# Patient Record
Sex: Female | Born: 1970 | Race: White | Hispanic: No | Marital: Married | Smoking: Never smoker
Health system: Southern US, Community
[De-identification: ages and names within clinical notes are randomized; demographics above are authoritative.]

## PROBLEM LIST (undated history)

## (undated) ENCOUNTER — Inpatient Hospital Stay (HOSPITAL_COMMUNITY): Payer: Self-pay

## (undated) DIAGNOSIS — T7840XA Allergy, unspecified, initial encounter: Secondary | ICD-10-CM

## (undated) DIAGNOSIS — R51 Headache: Secondary | ICD-10-CM

## (undated) DIAGNOSIS — R011 Cardiac murmur, unspecified: Secondary | ICD-10-CM

## (undated) HISTORY — DX: Allergy, unspecified, initial encounter: T78.40XA

## (undated) HISTORY — DX: Cardiac murmur, unspecified: R01.1

## (undated) HISTORY — DX: Headache: R51

---

## 2006-07-18 ENCOUNTER — Ambulatory Visit: Payer: Self-pay | Admitting: Family Medicine

## 2006-08-14 ENCOUNTER — Encounter: Payer: Self-pay | Admitting: Family Medicine

## 2006-08-14 ENCOUNTER — Encounter: Admission: RE | Admit: 2006-08-14 | Discharge: 2006-08-14 | Payer: Self-pay | Admitting: Gynecology

## 2006-08-22 ENCOUNTER — Encounter: Admission: RE | Admit: 2006-08-22 | Discharge: 2006-08-22 | Payer: Self-pay | Admitting: Gynecology

## 2006-09-18 ENCOUNTER — Ambulatory Visit (HOSPITAL_BASED_OUTPATIENT_CLINIC_OR_DEPARTMENT_OTHER): Admission: RE | Admit: 2006-09-18 | Discharge: 2006-09-18 | Payer: Self-pay | Admitting: Gynecology

## 2006-09-18 ENCOUNTER — Encounter (INDEPENDENT_AMBULATORY_CARE_PROVIDER_SITE_OTHER): Payer: Self-pay | Admitting: Specialist

## 2006-10-17 ENCOUNTER — Ambulatory Visit: Payer: Self-pay | Admitting: Family Medicine

## 2007-10-09 ENCOUNTER — Other Ambulatory Visit: Admission: RE | Admit: 2007-10-09 | Discharge: 2007-10-09 | Payer: Self-pay | Admitting: Gynecology

## 2007-11-02 ENCOUNTER — Other Ambulatory Visit: Admission: RE | Admit: 2007-11-02 | Discharge: 2007-11-02 | Payer: Self-pay | Admitting: Gynecology

## 2008-04-04 ENCOUNTER — Ambulatory Visit: Payer: Self-pay | Admitting: Family Medicine

## 2008-04-04 DIAGNOSIS — R51 Headache: Secondary | ICD-10-CM

## 2008-04-04 DIAGNOSIS — Z8679 Personal history of other diseases of the circulatory system: Secondary | ICD-10-CM | POA: Insufficient documentation

## 2008-04-04 DIAGNOSIS — R519 Headache, unspecified: Secondary | ICD-10-CM | POA: Insufficient documentation

## 2008-05-10 ENCOUNTER — Ambulatory Visit: Payer: Self-pay | Admitting: Gynecology

## 2008-07-04 ENCOUNTER — Ambulatory Visit: Payer: Self-pay | Admitting: Gynecology

## 2008-07-12 ENCOUNTER — Ambulatory Visit: Payer: Self-pay | Admitting: Family Medicine

## 2008-07-12 DIAGNOSIS — H9190 Unspecified hearing loss, unspecified ear: Secondary | ICD-10-CM | POA: Insufficient documentation

## 2008-09-30 ENCOUNTER — Ambulatory Visit: Payer: Self-pay | Admitting: Gynecology

## 2008-10-25 ENCOUNTER — Other Ambulatory Visit: Admission: RE | Admit: 2008-10-25 | Discharge: 2008-10-25 | Payer: Self-pay | Admitting: Gynecology

## 2008-10-25 ENCOUNTER — Encounter: Payer: Self-pay | Admitting: Gynecology

## 2008-10-25 ENCOUNTER — Ambulatory Visit: Payer: Self-pay | Admitting: Gynecology

## 2008-10-26 ENCOUNTER — Ambulatory Visit: Payer: Self-pay | Admitting: Gynecology

## 2008-11-11 ENCOUNTER — Ambulatory Visit: Payer: Self-pay | Admitting: Gynecology

## 2008-11-15 ENCOUNTER — Ambulatory Visit: Payer: Self-pay | Admitting: Gynecology

## 2008-11-18 ENCOUNTER — Ambulatory Visit: Payer: Self-pay | Admitting: Gynecology

## 2008-11-23 ENCOUNTER — Ambulatory Visit: Payer: Self-pay | Admitting: Family Medicine

## 2008-11-23 LAB — CONVERTED CEMR LAB

## 2008-11-24 LAB — CONVERTED CEMR LAB
AST: 25 units/L (ref 0–37)
Albumin: 3.7 g/dL (ref 3.5–5.2)
Basophils Absolute: 0 10*3/uL (ref 0.0–0.1)
Bilirubin, Direct: 0 mg/dL (ref 0.0–0.3)
Cholesterol: 163 mg/dL (ref 0–200)
Eosinophils Absolute: 0.3 10*3/uL (ref 0.0–0.7)
HDL: 58.4 mg/dL (ref >39.00)
Hemoglobin: 12.8 g/dL (ref 12.0–15.0)
LDL Cholesterol: 89 mg/dL (ref 0–99)
MCHC: 35.1 g/dL (ref 30.0–36.0)
MCV: 91.1 fL (ref 78.0–100.0)
Platelets: 245 10*3/uL (ref 150.0–400.0)
Potassium: 4.5 meq/L (ref 3.5–5.1)
RDW: 11.7 % (ref 11.5–14.6)
VLDL: 16 mg/dL (ref 0.0–40.0)
WBC: 7 10*3/uL (ref 4.5–10.5)

## 2008-11-30 ENCOUNTER — Ambulatory Visit: Payer: Self-pay | Admitting: Family Medicine

## 2009-03-09 ENCOUNTER — Ambulatory Visit (HOSPITAL_COMMUNITY): Admission: RE | Admit: 2009-03-09 | Discharge: 2009-03-09 | Payer: Self-pay | Admitting: Obstetrics and Gynecology

## 2009-03-14 ENCOUNTER — Ambulatory Visit (HOSPITAL_COMMUNITY): Admission: RE | Admit: 2009-03-14 | Discharge: 2009-03-14 | Payer: Self-pay | Admitting: Obstetrics and Gynecology

## 2009-03-21 ENCOUNTER — Ambulatory Visit (HOSPITAL_COMMUNITY): Admission: RE | Admit: 2009-03-21 | Discharge: 2009-03-21 | Payer: Self-pay | Admitting: Obstetrics and Gynecology

## 2009-03-25 ENCOUNTER — Inpatient Hospital Stay (HOSPITAL_COMMUNITY): Admission: AD | Admit: 2009-03-25 | Discharge: 2009-03-27 | Payer: Self-pay | Admitting: Obstetrics and Gynecology

## 2009-05-02 ENCOUNTER — Ambulatory Visit (HOSPITAL_COMMUNITY): Admission: RE | Admit: 2009-05-02 | Discharge: 2009-05-02 | Payer: Self-pay | Admitting: Obstetrics and Gynecology

## 2009-07-15 ENCOUNTER — Inpatient Hospital Stay (HOSPITAL_COMMUNITY): Admission: AD | Admit: 2009-07-15 | Discharge: 2009-07-17 | Payer: Self-pay | Admitting: Obstetrics and Gynecology

## 2009-08-11 ENCOUNTER — Ambulatory Visit: Admission: RE | Admit: 2009-08-11 | Discharge: 2009-08-11 | Payer: Self-pay | Admitting: Obstetrics and Gynecology

## 2009-12-28 ENCOUNTER — Ambulatory Visit: Payer: Self-pay | Admitting: Family Medicine

## 2009-12-29 LAB — CONVERTED CEMR LAB
AST: 16 units/L (ref 0–37)
Albumin: 4.3 g/dL (ref 3.5–5.2)
Alkaline Phosphatase: 81 units/L (ref 39–117)
Basophils Absolute: 0 10*3/uL (ref 0.0–0.1)
Chloride: 103 meq/L (ref 96–112)
Cholesterol: 164 mg/dL (ref 0–200)
Eosinophils Absolute: 0.1 10*3/uL (ref 0.0–0.7)
Eosinophils Relative: 3.1 % (ref 0.0–5.0)
GFR calc non Af Amer: 165.01 mL/min (ref >60)
Glucose, Bld: 76 mg/dL (ref 70–99)
HDL: 64.4 mg/dL (ref >39.00)
Hemoglobin: 12.8 g/dL (ref 12.0–15.0)
LDL Cholesterol: 96 mg/dL (ref 0–99)
Lymphocytes Relative: 30.3 % (ref 12.0–46.0)
MCHC: 34.7 g/dL (ref 30.0–36.0)
Monocytes Absolute: 0.4 10*3/uL (ref 0.1–1.0)
Monocytes Relative: 10.1 % (ref 3.0–12.0)
Neutro Abs: 2.2 10*3/uL (ref 1.4–7.7)
RDW: 13.8 % (ref 11.5–14.6)
Specific Gravity, Urine: >=1.03 (ref 1.000–1.030)
Total Protein, Urine: NEGATIVE mg/dL
Total Protein: 7 g/dL (ref 6.0–8.3)
Urine Glucose: NEGATIVE mg/dL
VLDL: 3.6 mg/dL (ref 0.0–40.0)
WBC: 4 10*3/uL — ABNORMAL LOW (ref 4.5–10.5)

## 2010-01-09 ENCOUNTER — Ambulatory Visit: Payer: Self-pay | Admitting: Family Medicine

## 2010-03-02 ENCOUNTER — Ambulatory Visit: Payer: Self-pay | Admitting: Family Medicine

## 2010-06-03 ENCOUNTER — Encounter: Payer: Self-pay | Admitting: Obstetrics and Gynecology

## 2010-06-12 NOTE — Assessment & Plan Note (Signed)
Summary: CPX/CJR   Vital Signs:  Patient profile:   40 year old female Height:      63 inches Weight:      117 pounds BMI:     20.80 O2 Sat:      97 % Temp:     98.2 degrees F Pulse rate:   78 / minute BP sitting:   102 / 62  (left arm)  Vitals Entered By: Pura Spice, RN (January 09, 2010 9:20 AM) CC: cpx has a 55 month old in which she is breast pumping. has questions about fish oil and alopecia. states lower back gets sore.  sees dr love gyn. Is Patient Diabetic? No   History of Present Illness: 40 yr old female for a cpx. She is doing well with no complaints. She gave birth 5 and 1/2 months ago, and she is still pumping breast milk. She may decide to stop in the next month or so.   Preventive Screening-Counseling & Management  Alcohol-Tobacco     Smoking Status: never  Allergies (verified): No Known Drug Allergies  Past History:  Past Medical History: Allergic rhinitis Headache Heart Murmur as child, resolved sees Dr. Rana Snare  for GYN exams  Past Surgical History: Infertility procedures 7/08  Family History: Reviewed history from 04/04/2008 and no changes required. Family History of Arthritis Family History of CAD Female 1st degree relative <50 Family History of Colon CA 1st degree relative <60 Family History Diabetes 1st degree relative Family History Hypertension Family History Lung cancer Family History Ovarian cancer Family History of Stroke M 1st degree relative <50  Social History: Reviewed history from 04/04/2008 and no changes required. Married Never Smoked Alcohol use-yes Drug use-no Regular exercise-yes  Review of Systems  The patient denies anorexia, fever, weight loss, weight gain, vision loss, decreased hearing, hoarseness, chest pain, syncope, dyspnea on exertion, peripheral edema, prolonged cough, headaches, hemoptysis, abdominal pain, melena, hematochezia, severe indigestion/heartburn, hematuria, incontinence, genital sores, muscle  weakness, suspicious skin lesions, transient blindness, difficulty walking, depression, unusual weight change, abnormal bleeding, enlarged lymph nodes, angioedema, breast masses, and testicular masses.    Physical Exam  General:  Well-developed,well-nourished,in no acute distress; alert,appropriate and cooperative throughout examination Head:  Normocephalic and atraumatic without obvious abnormalities. No apparent alopecia or balding. Eyes:  No corneal or conjunctival inflammation noted. EOMI. Perrla. Funduscopic exam benign, without hemorrhages, exudates or papilledema. Vision grossly normal. Ears:  External ear exam shows no significant lesions or deformities.  Otoscopic examination reveals clear canals, tympanic membranes are intact bilaterally without bulging, retraction, inflammation or discharge. Hearing is grossly normal bilaterally. Nose:  External nasal examination shows no deformity or inflammation. Nasal mucosa are pink and moist without lesions or exudates. Mouth:  Oral mucosa and oropharynx without lesions or exudates.  Teeth in good repair. Neck:  No deformities, masses, or tenderness noted. Chest Wall:  No deformities, masses, or tenderness noted. Lungs:  Normal respiratory effort, chest expands symmetrically. Lungs are clear to auscultation, no crackles or wheezes. Heart:  Normal rate and regular rhythm. S1 and S2 normal without gallop, murmur, click, rub or other extra sounds. Abdomen:  Bowel sounds positive,abdomen soft and non-tender without masses, organomegaly or hernias noted. Msk:  No deformity or scoliosis noted of thoracic or lumbar spine.   Pulses:  R and L carotid,radial,femoral,dorsalis pedis and posterior tibial pulses are full and equal bilaterally Extremities:  No clubbing, cyanosis, edema, or deformity noted with normal full range of motion of all joints.   Neurologic:  No  cranial nerve deficits noted. Station and gait are normal. Plantar reflexes are down-going  bilaterally. DTRs are symmetrical throughout. Sensory, motor and coordinative functions appear intact. Skin:  Intact without suspicious lesions or rashes Cervical Nodes:  No lymphadenopathy noted Axillary Nodes:  No palpable lymphadenopathy Inguinal Nodes:  No significant adenopathy Psych:  Cognition and judgment appear intact. Alert and cooperative with normal attention span and concentration. No apparent delusions, illusions, hallucinations   Impression & Recommendations:  Problem # 1:  HEALTH MAINTENANCE EXAM (ICD-V70.0)  Complete Medication List: 1)  Citranatal Rx 27-1 Mg Tabs (Prenat w/o [id number]) .... Once daily 2)  Vitamin D 400 Unit Caps (Cholecalciferol) .... Once daily  Patient Instructions: 1)  Please schedule a follow-up appointment in 1 year.

## 2010-06-12 NOTE — Assessment & Plan Note (Signed)
Summary: FLU SHOT/CJR/PT RSC/CJR  Nurse Visit   Review of Systems       Flu Vaccine Consent Questions     Do you have a history of severe allergic reactions to this vaccine? no    Any prior history of allergic reactions to egg and/or gelatin? no    Do you have a sensitivity to the preservative Thimersol? no    Do you have a past history of Guillan-Barre Syndrome? no    Do you currently have an acute febrile illness? no    Have you ever had a severe reaction to latex? no    Vaccine information given and explained to patient? yes    Are you currently pregnant? no    Lot Number:[id number]   Exp Date:11/10/2010   Site Given  Left Deltoid IM Pura Spice, RN  March 02, 2010 2:52 PM    Allergies: No Known Drug Allergies  Orders Added: 1)  Flu Vaccine 41yrs + MEDICARE PATIENTS [Q2039] 2)  Administration Flu vaccine - MCR [G0008]

## 2010-08-06 LAB — CBC
HCT: 36.1 % (ref 36.0–46.0)
Hemoglobin: 12.4 g/dL (ref 12.0–15.0)
Platelets: 118 10*3/uL — ABNORMAL LOW (ref 150–400)
Platelets: 153 10*3/uL (ref 150–400)
RBC: 3.28 MIL/uL — ABNORMAL LOW (ref 3.87–5.11)
RDW: 13.2 % (ref 11.5–15.5)
RDW: 13.8 % (ref 11.5–15.5)
WBC: 10.3 10*3/uL (ref 4.0–10.5)
WBC: 11.5 10*3/uL — ABNORMAL HIGH (ref 4.0–10.5)

## 2010-08-15 LAB — URINALYSIS, ROUTINE W REFLEX MICROSCOPIC
Glucose, UA: NEGATIVE mg/dL
Hgb urine dipstick: NEGATIVE
Ketones, ur: NEGATIVE mg/dL
Specific Gravity, Urine: 1.02 (ref 1.005–1.030)
pH: 5.5 (ref 5.0–8.0)

## 2010-08-15 LAB — CBC
HCT: 33.6 % — ABNORMAL LOW (ref 36.0–46.0)
Hemoglobin: 11.4 g/dL — ABNORMAL LOW (ref 12.0–15.0)
MCV: 93.8 fL (ref 78.0–100.0)
Platelets: 215 10*3/uL (ref 150–400)
RDW: 12.8 % (ref 11.5–15.5)
WBC: 9.4 10*3/uL (ref 4.0–10.5)

## 2010-08-15 LAB — WET PREP, GENITAL: Yeast Wet Prep HPF POC: NONE SEEN

## 2010-08-27 ENCOUNTER — Other Ambulatory Visit: Payer: Self-pay | Admitting: Obstetrics and Gynecology

## 2010-08-27 DIAGNOSIS — Z1231 Encounter for screening mammogram for malignant neoplasm of breast: Secondary | ICD-10-CM

## 2010-08-30 ENCOUNTER — Ambulatory Visit
Admission: RE | Admit: 2010-08-30 | Discharge: 2010-08-30 | Disposition: A | Discharge status: Home / Self Care | Payer: Managed Care, Other (non HMO) | Source: Ambulatory Visit | Attending: Obstetrics and Gynecology | Admitting: Obstetrics and Gynecology

## 2010-08-30 DIAGNOSIS — Z1231 Encounter for screening mammogram for malignant neoplasm of breast: Secondary | ICD-10-CM

## 2010-09-28 NOTE — H&P (Signed)
NAMECAYLOR, Tracy Baldwin         ACCOUNT NO.:  [id number]   MEDICAL RECORD NO.:  [id number]          PATIENT TYPE:  AMB   LOCATION:  [facility]                         FACILITY:  [facility], [place]   PHYSICIAN:  Timothy P. Fontaine, M.D.DATE OF BIRTH:  [birth year]   DATE OF ADMISSION:  09/18/2006  DATE OF DISCHARGE:                              HISTORY & PHYSICAL   CHIEF COMPLAINT:  Increasing dysmenorrhea, menorrhagia, family history  of endometriosis, infertility.   HISTORY OF PRESENT ILLNESS:  [age]-year-old G-0 with increasing  dysmenorrhea, menorrhagia, family history of endometriosis, who has been  attempting pregnancy without success despite stimulated cycles with good  ovarian response.  The patient is admitted at this time for  hysteroscopy, chromopertubation laparoscopy, rule out endometriosis,  other pelvic pathology.   PAST MEDICAL HISTORY:  Uncomplicated.   PAST SURGICAL HISTORY:  Cautery of cervix x2, wisdom teeth.   ALLERGIES:  No medications.   CURRENT MEDICATIONS:  None.   REVIEW OF SYSTEMS:  Noncontributory.   FAMILY HISTORY:  Noncontributory.   SOCIAL HISTORY:  Noncontributory.   PHYSICAL EXAMINATION:  Afebrile.  Vital signs are stable.  HEENT:  Normal.  LUNGS:  Clear.  CARDIAC:  Regular rate, no rubs, murmurs or gallops.  ABDOMINAL EXAM:  Benign.  PELVIC:  External BUS, vagina normal.  Cervix normal.  Uterus normal  size, midline, mobile, nontender.  Adnexa without masses or tenderness.   ASSESSMENT:  [age]-year-old G-0, history of infertility undergoing  stimulated cycles without pregnancy, history of increasing dysmenorrhea,  increasingly heavy periods, strong family history of endometriosis for  laparoscopy, chromopertubation, hysteroscopy.  I reviewed with the  patient the proposed surgery to include instrumentation, use of the  hysteroscope, laparoscope, trocar placement, multiple port sites, sharp  blunt dissection, electrocautery use of laser in  the chromopertubation.  Expected intraoperative postoperative courses, acute intraoperative  postoperative risks were all reviewed with her.  She understands there  are no guarantees as far as results and subsequent pregnancies, and she  understands and accepts this.  The risks of inadvertent injury to  internal organs either during the laparoscopic portion or  hysteroscopically secondary to perforation leading to damage to internal  organs, bowel, bladder, ureters, vessels and nerves either immediately  recognized or delay recognized following the surgery necessitating major  exploratory reparative surgeries, bowel resection, future reparative  surgeries, ostomy formation was all discussed, understood and accepted.  The risks of major vascular injury, hemorrhage necessitating transfusion  and the risks of transfusion were reviewed to include transfusion  reaction, hepatitis, HIV, mad cow disease and other unknown entities.  The risks of infection following the procedure were discussed, potential  needs for prolonged antibiotics, incisional complications, incisional  infections, hernia formation, opening and draining of incisions, closure  by secondary intention was all discussed, understood and accepted.  The  patient's questions were answered to her satisfaction.  She is ready to  proceed with surgery and again she understands there are no guarantees  as far as pregnancies following this procedure and she understands and  accepts this.      Timothy P. Fontaine, M.D.  Electronically Signed     TPF/MEDQ  D:  09/04/2006  T:  09/04/2006  Job:  [id number]

## 2010-09-28 NOTE — Op Note (Signed)
Tracy Baldwin, Tracy Baldwin         ACCOUNT NO.:  [id number]   MEDICAL RECORD NO.:  [id number]          PATIENT TYPE:  AMB   LOCATION:  [facility]                         FACILITY:  [facility]   PHYSICIAN:  Timothy P. Fontaine, M.D.DATE OF BIRTH:  Mar 25, 1971   DATE OF PROCEDURE:  09/18/2006  DATE OF DISCHARGE:                               OPERATIVE REPORT   PREOPERATIVE DIAGNOSIS:  Increasing dysmenorrhea, menorrhagia,  infertility, rule out endometriosis.   POSTOPERATIVE DIAGNOSE:  1. endometriosis.  2. Right ovarian cyst.   PROCEDURE:  1. Diagnostic hysteroscopy.  2. Laparoscopic biopsy fulguration endometriosis.  3. Excision right ovarian cyst.  4. Chromopertubation.   SURGEON:  Dr. Audie Box.   ANESTHETIC:  General.   ESTIMATED BLOOD LOSS:  Minimal.   SPECIMENS:  1. Peritoneal biopsy.  2. Right ovarian cyst.   COMPLICATIONS:  None.   FINDINGS:  1. EUA external BUS, vagina normal; cervix normal, uterus normal size,      anteverted; adnexa without masses.  2. Hysteroscopic is normal noting fundus, anterior-posterior uterine      surfaces, lower uterine segment, right and left cornual regions,      and the cervical canal all visualized and normal.  3. Laparoscopic.  Anterior cul-de-sac with classic powder-burn lesion,      right vesicouterine fold.  Posterior cul-de-sac with multiple      classic powder-burn lesions mid posterior cul-de-sac.  Uterus      normal size, shape and contour; right and left fallopian tubes      normal length, caliber, fimbriated ends, positive fill and spill      bilaterally.  Left ovary grossly normal, free and mobile.  Right      ovary with cystic mass, pedunculated-like off of the cortex      overlying brownish stainings, questionable endometrioma versus      corpus lutetium.  The area was incised with chocolate fluid      extruding, although surrounding stroma appeared to be a classic      corpus luteum.  The cyst was excised in its entirety.   Upper      abdominal exam was grossly normal.  Liver smooth, no abnormalities,      gallbladder visualized and normal.  Appendix grossly normal, free,      and mobile.  No upper abdominal adhesions or other pathology.   PROCEDURE:  The patient was taken to the operating room, underwent  general anesthesia, was placed in low dorsal lithotomy position,  received an abdominal, perineal, vaginal preparation with Betadine  solution per nursing personnel, and the bladder was emptied with in-and-  out Foley catheterization.  The patient was draped in the usual fashion,  EUA performed.  Circumflex visualized with a speculum anterior lip  grasped with a single-tooth tenaculum, and the cervix was gently dilated  to admit the diagnostic hysteroscope.  Hysteroscopy was performed with  findings noted above.  The Cowen cannula was then placed on the cervix  for the chromopertubation.  After regloving, attention was then turned  to the laparoscopic portion of the case.  A vertical infraumbilical  incision was made.  The 10 mm laparoscopic visual  direct entry port was  then placed under direct visualization without difficulty.  The abdomen  was subsequently insufflated.  Right and left 5 mm suprapubic ports were  then placed without difficulty under direct visualization after  transillumination for the vessels.  Examination of the pelvic organs;  upper abdominal exam was then carried out with findings noted above.  A  biopsy of the anterior cul-de-sac endometriotic implant was taken, and  subsequently all visible areas of endometriosis were bipolar cauterized.  There was no residual endometriosis visualized.  Chromopertubation was  then performed showing fill and spill bilaterally and lastly, the right  ovarian cyst was inspected.  Due to the brownish cortex staining, the  question of an endometrioma was raised, and it was felt prudent to go  ahead and excise this area.  On incision, chocolate material  extruded,  although inspection of the surrounding cortex appeared to be a classic  corpus luteum.  The cyst was excised.  Subsequently bipolar cautery was  applied to the stroma and cortex edge to achieve ultimate hemostasis.  The pelvis was copiously irrigated.  Hemostasis visualized at all sites.  Intercede was then placed over all biopsy fulguration and right ovarian  excision site.  The suprapubic ports were removed.  Gas was allowed to  escape, all sites inspected under a low pressure situation showing  adequate hemostasis.  Subsequently the infraumbilical port was then  backed down under direct visualization showing adequate hemostasis and  no evidence of hernia formation.  All skin sites were injected using  0.25% Marcaine.  The suprapubic ports were closed using Dermabond skin  adhesive and due to some slight oozing in the infraumbilical port, this  incision was closed using 4-0 Monocryl and interrupted cutaneous stitch.  Dressing was applied.  The Cowen cannula and tenaculum were removed from  the cervix.  The Foley catheter which had previously been placed under  sterile technique had been removed, the patient placed in a supine  position, awakened without difficulty and taken to the recovery room in  good condition having tolerated the procedure well.      Timothy P. Fontaine, M.D.  Electronically Signed     TPF/MEDQ  D:  09/18/2006  T:  09/18/2006  Job:  [id number]

## 2010-09-28 NOTE — Assessment & Plan Note (Signed)
[facility] OFFICE NOTE   Tracy, Baldwin                  MRN:          [id number]  DATE:07/18/2006                            DOB:          01/03/1971    This is a 40 year old woman here to establish with our practice with  several issues to discuss.  She moved to [place] from [place],  [place] last summer, and is just now establishing with a physician.  First off, she has had 2 different painful spots on the right hand and  wrist, both of which began at the same time about 1 year ago.  She has a  painful nodule around the base knuckle of her right 3rd finger.  It  seems to get larger at times, and gets smaller at times, but it never  goes away.  Occasionally, it is mildly painful when doing her daily  activities, but most of the time it does not bother her.  It does not  inhibit her range of motion either.  A bit more painful of late,  however, has been a tender spot on the back of her right wrist.  There  have been no lumps or nodules that she could find.  No swelling.  Certainly, no history of trauma with it.  She has tried Tylenol  intermittently, but it does not seem to help.  Of note, she and her  husband are currently going through treatments for infertility, so she  has been prohibited from taking any type of nonsteroidal  antiinflammatory medications.  Also, about 1 week ago while putting on a  pair of pants, she twisted her left 2nd toe.  It was mildly swollen and  mildly painful at first.  She would like for me to look at it now, but  it does not bother her to walk on it.  Lastly, she wonders if she may be  allergic to cats.  She occasionally has trouble with itchy eyes, runny  nose, and sneezing.  This seems to be worse when she is in a household  with cats in it.  She is asking for me to refer her to an allergist for  full allergy testing.  She has used Claritin and other agents over  the  counter with fairly good results.   PAST MEDICAL HISTORY:  She is a G0 P0.  She and her husband have been  trying to get pregnant for the past 2 years.  She is currently working  with Dr. Audie Box along these lines.  She is taking Clomid, and is  taking some other type of shots around ovulation to help her with this  issue.  She has had a full workup, and so has her husband, and no  particular cause of the infertility has been found.  They have gone 2  sets of intrauterine inseminations, and will be trying their 3rd time  coming up some time soon.  Otherwise, she had a heart murmur when she  was younger, but seems to have grown out of it.  She has migraine  headaches, which are well controlled over-the-counter medications.  They  are not particularly bad, and not particularly frequent.  She had an  episode of blood in her stool in the late 1990s.  Workup included a full  colonoscopy that was completely normal, although the cause was  determined to be a small anal fissure that healed.   HABITS:  She drinks some alcohol.  She does not use tobacco.   SOCIAL HISTORY:  She is married.  She works as a Comptroller.   FAMILY HISTORY:  Remarkable for precancerous colon polyps in her father,  and colon cancer in an aunt.  Also, some history of stroke,  hypertension, and diabetes in the family.   OBJECTIVE:  Height 5 feet 2 inches.  Weight 128.  BP 98/70.  Pulse 72  and regular.  GENERAL:  She appears to be healthy.  She walks easily.  Examination of her left foot is completely  unremarkable.  Examination of the right hand shows a small, mobile,  tender ganglion cyst on the flexor surface of the 3rd  metacarpophalangeal joint.  She also has a small, mobile, tender cyst on  the dorsal surface of the right wrist.  It does not inhibit range of  motion.   ASSESSMENT AND PLAN:  1. Two ganglion cysts on the right wrist and hand.  I advised her to      purchase an over-the-counter wrist brace  to immobilize the area to      see if it cannot get better.  Obviously, we can use      antiinflammatory nonsteroidal drugs at this time.  If a month or so      goes by, and it is still bothering her, we may refer her to      Orthopedics.  2. Recent toe sprain.  Apparently resolved.  3. Desirous for allergy testing.  We will refer her to Allergy soon.     Tera Mater. Clent Ridges, MD  Electronically Signed    SAF/MedQ  DD: 07/18/2006  DT: 07/19/2006  Job #: [id number]

## 2011-01-01 ENCOUNTER — Telehealth: Payer: Self-pay | Admitting: *Deleted

## 2011-01-01 NOTE — Telephone Encounter (Signed)
We can do that for her as long as we have specific orders from [facility].  In review of her chart she has not been here for 2 years and she has not had an annual exam and that time she really needs to come in and let us do an annual exam also.

## 2011-01-01 NOTE — Telephone Encounter (Signed)
Pt informed and states she had her pap at another facility and will get [place] a copy just so we can have our records updated. [facility] sends specific orders. KW

## 2011-01-01 NOTE — Telephone Encounter (Signed)
Patient is starting to do another cycle of IVF at [facility] and would like to do her screening US and labwork through our office. Is that something that can be done? We will do the tests and then fax results to [facility]. PLease advise. Thank you.

## 2011-01-10 ENCOUNTER — Encounter: Payer: Self-pay | Admitting: Gynecology

## 2011-01-11 ENCOUNTER — Encounter: Payer: Self-pay | Admitting: Family Medicine

## 2011-01-11 ENCOUNTER — Ambulatory Visit (INDEPENDENT_AMBULATORY_CARE_PROVIDER_SITE_OTHER): Payer: Managed Care, Other (non HMO) | Admitting: Family Medicine

## 2011-01-11 VITALS — BP 100/70 | HR 82 | Temp 98.5°F | Wt 134.0 lb

## 2011-01-11 DIAGNOSIS — S6390XA Sprain of unspecified part of unspecified wrist and hand, initial encounter: Secondary | ICD-10-CM

## 2011-01-11 DIAGNOSIS — S63609A Unspecified sprain of unspecified thumb, initial encounter: Secondary | ICD-10-CM

## 2011-01-11 DIAGNOSIS — M545 Low back pain: Secondary | ICD-10-CM

## 2011-01-11 NOTE — Progress Notes (Signed)
  Subjective:    Patient ID: Tracy Baldwin, female    DOB: Mar 20, 1971, 40 y.o.   MRN: [id number]  HPI Here for several reasons. First about 5 weeks ago she jammed her right thumb into the back of a chair. She has had some pain in the IP joint ever since. Not much swelling. Also she has had some intermittent sharp low back pains for years. Takes Motrin at times. She and her husband are undergoing fertility treatments again.    Review of Systems  Constitutional: Negative.   Gastrointestinal: Negative.   Genitourinary: Negative.   Musculoskeletal: Positive for back pain and arthralgias.       Objective:   Physical Exam  Constitutional: She appears well-developed and well-nourished.  Musculoskeletal:       The right thumb shows no swelling and full ROM. Extremes of flexion and extension are painful. The low back has full ROM and no tenderness.           Assessment & Plan:  Refer to Orthopedics to check the thumb and the back.

## 2011-03-05 ENCOUNTER — Telehealth: Payer: Self-pay | Admitting: *Deleted

## 2011-03-05 DIAGNOSIS — Z331 Pregnant state, incidental: Secondary | ICD-10-CM

## 2011-03-05 NOTE — Telephone Encounter (Signed)
[facility] is requesting a HCG order put in the system for 11/2 and if positive do consecutive levels for patient. Is the ok?

## 2011-03-05 NOTE — Telephone Encounter (Signed)
okay

## 2011-03-06 NOTE — Telephone Encounter (Signed)
Thanks

## 2011-03-15 ENCOUNTER — Ambulatory Visit (INDEPENDENT_AMBULATORY_CARE_PROVIDER_SITE_OTHER): Payer: Managed Care, Other (non HMO) | Admitting: Gynecology

## 2011-03-15 DIAGNOSIS — N979 Female infertility, unspecified: Secondary | ICD-10-CM

## 2011-03-15 DIAGNOSIS — Z331 Pregnant state, incidental: Secondary | ICD-10-CM

## 2011-03-15 LAB — PROGESTERONE: Progesterone: 27 ng/mL

## 2011-03-18 ENCOUNTER — Ambulatory Visit (INDEPENDENT_AMBULATORY_CARE_PROVIDER_SITE_OTHER): Payer: Managed Care, Other (non HMO) | Admitting: Gynecology

## 2011-03-18 DIAGNOSIS — N979 Female infertility, unspecified: Secondary | ICD-10-CM

## 2011-03-21 ENCOUNTER — Other Ambulatory Visit (INDEPENDENT_AMBULATORY_CARE_PROVIDER_SITE_OTHER): Payer: Managed Care, Other (non HMO) | Admitting: Gynecology

## 2011-03-21 DIAGNOSIS — N979 Female infertility, unspecified: Secondary | ICD-10-CM

## 2011-04-16 LAB — OB RESULTS CONSOLE GC/CHLAMYDIA
Chlamydia: NEGATIVE
Gonorrhea: NEGATIVE

## 2011-04-16 LAB — OB RESULTS CONSOLE RPR: RPR: NONREACTIVE

## 2011-05-03 ENCOUNTER — Encounter: Payer: Self-pay | Admitting: Family Medicine

## 2011-05-03 ENCOUNTER — Ambulatory Visit (INDEPENDENT_AMBULATORY_CARE_PROVIDER_SITE_OTHER): Payer: Managed Care, Other (non HMO) | Admitting: Family Medicine

## 2011-05-03 VITALS — BP 120/68 | HR 101 | Temp 98.5°F | Wt 142.0 lb

## 2011-05-03 DIAGNOSIS — J40 Bronchitis, not specified as acute or chronic: Secondary | ICD-10-CM

## 2011-05-03 MED ORDER — CEPHALEXIN 500 MG PO CAPS: 500.0000 mg | ORAL_CAPSULE | Freq: Three times a day (TID) | ORAL | Status: AC

## 2011-05-03 NOTE — Progress Notes (Signed)
  Subjective:    Patient ID: Tracy Baldwin, female    DOB: 18-Nov-1970, 40 y.o.   MRN: [id number]  HPI Here for 5 weeks of chest congestion and a dry cough. She had a fever at first but not now. She is [redacted] weeks pregnant.   Review of Systems  Constitutional: Negative.   HENT: Negative.   Eyes: Negative.   Respiratory: Positive for cough.        Objective:   Physical Exam  Constitutional: She appears well-developed and well-nourished.  HENT:  Right Ear: External ear normal.  Left Ear: External ear normal.  Nose: Nose normal.  Mouth/Throat: Oropharynx is clear and moist. No oropharyngeal exudate.  Eyes: Conjunctivae are normal.  Pulmonary/Chest: Effort normal and breath sounds normal.  Lymphadenopathy:    She has no cervical adenopathy.          Assessment & Plan:  Recheck prn

## 2011-05-14 NOTE — L&D Delivery Note (Signed)
Delivery Note At 4:23 PM a viable female was delivered via Vaginal, Spontaneous Delivery LOA presentation/compound presentation APGAR: 9 9  Placenta status: spontaneously, intact with 3 vessel cord Cord:  with the following complications: none  Cord pH: not done  Anesthesia: Epidural  Episiotomy: none Lacerations:none  Suture Repair: not applicable Est. Blood Loss (mL): 300  Mom to postpartum.  Baby to nursery-stable.  Tracy Baldwin L 11/01/2011, 4:30 PM

## 2011-07-04 ENCOUNTER — Other Ambulatory Visit: Payer: Self-pay | Admitting: Gynecology

## 2011-07-04 NOTE — Telephone Encounter (Signed)
Hardcopy chart will be on your desk.  Been awhile since patient in our office but it appears she has been working with [facility] due to infertility.

## 2011-10-10 ENCOUNTER — Inpatient Hospital Stay (HOSPITAL_COMMUNITY)
Admission: AD | Admit: 2011-10-10 | Discharge: 2011-10-10 | Disposition: A | Discharge status: Home / Self Care | Payer: Managed Care, Other (non HMO) | Source: Ambulatory Visit | Attending: Obstetrics and Gynecology | Admitting: Obstetrics and Gynecology

## 2011-10-10 ENCOUNTER — Encounter (HOSPITAL_COMMUNITY): Payer: Self-pay

## 2011-10-10 DIAGNOSIS — O4700 False labor before 37 completed weeks of gestation, unspecified trimester: Secondary | ICD-10-CM | POA: Insufficient documentation

## 2011-10-10 DIAGNOSIS — O479 False labor, unspecified: Secondary | ICD-10-CM

## 2011-10-10 MED ORDER — TERBUTALINE SULFATE 1 MG/ML IJ SOLN
0.2500 mg | Freq: Once | INTRAMUSCULAR | Status: AC
  Administered 2011-10-10: 0.25 mg via SUBCUTANEOUS

## 2011-10-10 MED ORDER — LACTATED RINGERS IV BOLUS (SEPSIS)
1000.0000 mL | Freq: Once | INTRAVENOUS | Status: AC
  Administered 2011-10-10: 1000 mL via INTRAVENOUS

## 2011-10-10 MED ORDER — TERBUTALINE SULFATE 1 MG/ML IJ SOLN
INTRAMUSCULAR | Status: AC
  Filled 2011-10-10: qty 1

## 2011-10-10 NOTE — MAU Note (Signed)
Patient states she was having a NST in the office and was noted to be having contractions. Patient was unaware of contractions and is having no pain, bleeding or leaking. Was sent for hydration and monitoring. Reports good fetal movement. Large container of water given to the patient to hydrate while waiting.

## 2011-10-10 NOTE — MAU Note (Signed)
Patient was sent from the office due contractions that are picked up by toco. Patient is not having any pain only mild pressure. She denies any n/v, vaginal bleeding or lof. She reports good fetal movement. She drinks plenty of fluids.

## 2011-10-10 NOTE — Progress Notes (Signed)
Dr Vincente Poli returned call and was notified of patient (she was aware that she was coming to MAU), tracing, ctx noted , sve result. Order given to discharge patient, patient to call office in the morning and ask for an appt with Dr. Rana Snare to be rechecked.

## 2011-10-10 NOTE — MAU Provider Note (Signed)
History     CSN: [id number]  Arrival date and time: 10/10/11 1534   First Provider Initiated Contact with Patient 10/10/11 1650      No chief complaint on file.  HPI  Tracy Baldwin 41 y.o. [redacted]w[redacted]d patient of Dr. Rana Snare presenting today for contractions noticed during her stress test today in the office.  Patient states has "not been good" about drinking her water today, but has noticed "pelvic pressure" off and on today.  Denies any cramping or discomfort.  Denies bleeding, leaking of fluid or vaginal discharge.  Denies any dysuria or urinary frequency. Evaluated in the office by Dr. Arelia Sneddon and sent to MAU for EFM, IV hydration and terbutaline. The history was provided by the patient.    OB History    Grav Para Term Preterm Abortions TAB SAB Ect Mult Living   2 1 1       1       Past Medical History  Diagnosis Date  . Allergy   . Headache   . Heart murmur     as a child/resolved    History reviewed. No pertinent past surgical history.  Family History  Problem Relation Age of Onset  . Arthritis      fhx  . Coronary artery disease      fhx  . Cancer      colon, lung, ovarian/fhx  . Diabetes      fhx  . Hypertension      fhx  . Stroke      fhx    History  Substance Use Topics  . Smoking status: Never Smoker   . Smokeless tobacco: Never Used  . Alcohol Use: No    Allergies: No Known Allergies  Prescriptions prior to admission  Medication Sig Dispense Refill  . Prenat w/o A-FeCbGl-DSS-FA-DHA (CITRANATAL 90 DHA) 90-1 & 300 MG MISC TAKE ONE BY MOUTH ONE TIME DAILY  60 each  6  . PRENATAL VITAMINS PO Take by mouth.          Review of Systems  Constitutional: Negative.   HENT: Negative.   Eyes: Negative.   Respiratory: Negative.   Cardiovascular: Negative.   Gastrointestinal: Positive for constipation.       States has had BMs daily, but they are "less than satisfying".  States has taken colace in the past for constipation, but is using some "go green  vegetable fiber" with last dose yesterday evening.  Genitourinary:       See HPI.  Musculoskeletal: Negative.   Skin: Negative.   Neurological: Negative.   Endo/Heme/Allergies: Negative.   Psychiatric/Behavioral: Negative.    Physical Exam   Blood pressure 117/69, pulse 80, temperature 98.5 F (36.9 C), temperature source Oral, resp. rate 16, height 5\' 3"  (1.6 m), weight 72.576 kg (160 lb), SpO2 99.00%.  Physical Exam  Constitutional: She is oriented to person, place, and time. She appears well-developed and well-nourished.  HENT:  Head: Normocephalic and atraumatic.  Cardiovascular: Normal rate, regular rhythm, normal heart sounds and intact distal pulses.   Respiratory: Effort normal and breath sounds normal.  GI: Soft. Bowel sounds are normal.       Able to palpate mild to moderate contractions.  Musculoskeletal: Normal range of motion.  Neurological: She is alert and oriented to person, place, and time.  Skin: Skin is warm and dry.  Psychiatric: She has a normal mood and affect. Her behavior is normal.   FHT baseline is 135, reactive. Patient is contracting every 2-6 minutes.  Patient sent from office by Dr. Arelia Sneddon.  Dr. Arelia Sneddon completed cervical check in the office.  Reported as closed.   MAU Course  Procedures  MDM  Dr. Adalberto Ill was consulted regarding the management of this patient. She was given IV fluids and terbutaline and contractions have slowed.  RN performed cervical check and reports patient is fingertip. Dr. Vincente Poli request the patient go home and increase her po fluids and follow up in the office in the morning.  Discharge orders received.  Assessment and Plan   Assessment:  Preterm contractions  Plan: Continue with oral hydration and rest.  No sexual intercourse.  Follow up with office in the morning. Return here if problems before then.   Servando Salina 10/10/2011, 5:03 PM

## 2011-11-01 ENCOUNTER — Encounter (HOSPITAL_COMMUNITY): Payer: Self-pay | Admitting: *Deleted

## 2011-11-01 ENCOUNTER — Inpatient Hospital Stay (HOSPITAL_COMMUNITY): Payer: Managed Care, Other (non HMO) | Admitting: Anesthesiology

## 2011-11-01 ENCOUNTER — Inpatient Hospital Stay (HOSPITAL_COMMUNITY)
Admission: AD | Admit: 2011-11-01 | Discharge: 2011-11-03 | DRG: 775 | Disposition: A | Discharge status: Home / Self Care | Payer: Managed Care, Other (non HMO) | Source: Ambulatory Visit | Attending: Obstetrics and Gynecology | Admitting: Obstetrics and Gynecology

## 2011-11-01 ENCOUNTER — Encounter (HOSPITAL_COMMUNITY): Payer: Self-pay | Admitting: Anesthesiology

## 2011-11-01 DIAGNOSIS — O99892 Other specified diseases and conditions complicating childbirth: Principal | ICD-10-CM | POA: Diagnosis present

## 2011-11-01 DIAGNOSIS — J309 Allergic rhinitis, unspecified: Secondary | ICD-10-CM

## 2011-11-01 DIAGNOSIS — Z22330 Carrier of Group B streptococcus: Secondary | ICD-10-CM

## 2011-11-01 DIAGNOSIS — R51 Headache: Secondary | ICD-10-CM

## 2011-11-01 DIAGNOSIS — J019 Acute sinusitis, unspecified: Secondary | ICD-10-CM

## 2011-11-01 DIAGNOSIS — O328XX0 Maternal care for other malpresentation of fetus, not applicable or unspecified: Secondary | ICD-10-CM | POA: Diagnosis present

## 2011-11-01 DIAGNOSIS — O09529 Supervision of elderly multigravida, unspecified trimester: Secondary | ICD-10-CM | POA: Diagnosis present

## 2011-11-01 DIAGNOSIS — Z8679 Personal history of other diseases of the circulatory system: Secondary | ICD-10-CM

## 2011-11-01 DIAGNOSIS — H9190 Unspecified hearing loss, unspecified ear: Secondary | ICD-10-CM

## 2011-11-01 LAB — CBC
HCT: 37.9 % (ref 36.0–46.0)
MCHC: 34.8 g/dL (ref 30.0–36.0)
MCV: 88.3 fL (ref 78.0–100.0)
Platelets: 180 10*3/uL (ref 150–400)
RDW: 13.6 % (ref 11.5–15.5)
WBC: 9.3 10*3/uL (ref 4.0–10.5)

## 2011-11-01 MED ORDER — PHENYLEPHRINE 40 MCG/ML (10ML) SYRINGE FOR IV PUSH (FOR BLOOD PRESSURE SUPPORT): 80.0000 ug | PREFILLED_SYRINGE | INTRAVENOUS | Status: DC | PRN

## 2011-11-01 MED ORDER — DIBUCAINE 1 % RE OINT: 1.0000 "application " | TOPICAL_OINTMENT | RECTAL | Status: DC | PRN

## 2011-11-01 MED ORDER — SIMETHICONE 80 MG PO CHEW
80.0000 mg | CHEWABLE_TABLET | ORAL | Status: DC | PRN
  Administered 2011-11-03: 80 mg via ORAL

## 2011-11-01 MED ORDER — FENTANYL 2.5 MCG/ML BUPIVACAINE 1/10 % EPIDURAL INFUSION (WH - ANES)
14.0000 mL/h | INTRAMUSCULAR | Status: DC
  Administered 2011-11-01: 14 mL/h via EPIDURAL
  Filled 2011-11-01: qty 60

## 2011-11-01 MED ORDER — ONDANSETRON HCL 4 MG/2ML IJ SOLN: 4.0000 mg | Freq: Four times a day (QID) | INTRAMUSCULAR | Status: DC | PRN

## 2011-11-01 MED ORDER — IBUPROFEN 600 MG PO TABS
600.0000 mg | ORAL_TABLET | Freq: Four times a day (QID) | ORAL | Status: DC
  Administered 2011-11-01 – 2011-11-03 (×7): 600 mg via ORAL
  Filled 2011-11-01 (×7): qty 1

## 2011-11-01 MED ORDER — OXYTOCIN 40 UNITS IN LACTATED RINGERS INFUSION - SIMPLE MED
1.0000 m[IU]/min | INTRAVENOUS | Status: DC
  Administered 2011-11-01: 2 m[IU]/min via INTRAVENOUS
  Filled 2011-11-01: qty 1000

## 2011-11-01 MED ORDER — OXYTOCIN 40 UNITS IN LACTATED RINGERS INFUSION - SIMPLE MED: 62.5000 mL/h | Freq: Once | INTRAVENOUS | Status: DC

## 2011-11-01 MED ORDER — DIPHENHYDRAMINE HCL 50 MG/ML IJ SOLN: 12.5000 mg | INTRAMUSCULAR | Status: DC | PRN

## 2011-11-01 MED ORDER — DIPHENHYDRAMINE HCL 25 MG PO CAPS: 25.0000 mg | ORAL_CAPSULE | Freq: Four times a day (QID) | ORAL | Status: DC | PRN

## 2011-11-01 MED ORDER — ONDANSETRON HCL 4 MG PO TABS: 4.0000 mg | ORAL_TABLET | ORAL | Status: DC | PRN

## 2011-11-01 MED ORDER — LACTATED RINGERS IV SOLN
INTRAVENOUS | Status: DC
  Administered 2011-11-01 (×2): via INTRAVENOUS

## 2011-11-01 MED ORDER — LACTATED RINGERS IV SOLN: 500.0000 mL | Freq: Once | INTRAVENOUS | Status: DC

## 2011-11-01 MED ORDER — EPHEDRINE 5 MG/ML INJ: 10.0000 mg | INTRAVENOUS | Status: DC | PRN

## 2011-11-01 MED ORDER — FLEET ENEMA 7-19 GM/118ML RE ENEM: 1.0000 | ENEMA | Freq: Every day | RECTAL | Status: DC | PRN

## 2011-11-01 MED ORDER — TERBUTALINE SULFATE 1 MG/ML IJ SOLN: 0.2500 mg | Freq: Once | INTRAMUSCULAR | Status: DC | PRN

## 2011-11-01 MED ORDER — SODIUM BICARBONATE 8.4 % IV SOLN
INTRAVENOUS | Status: DC | PRN
  Administered 2011-11-01: 4 mL via EPIDURAL

## 2011-11-01 MED ORDER — BISACODYL 10 MG RE SUPP: 10.0000 mg | Freq: Every day | RECTAL | Status: DC | PRN

## 2011-11-01 MED ORDER — EPHEDRINE 5 MG/ML INJ
10.0000 mg | INTRAVENOUS | Status: DC | PRN
  Filled 2011-11-01: qty 4

## 2011-11-01 MED ORDER — BENZOCAINE-MENTHOL 20-0.5 % EX AERO
1.0000 "application " | INHALATION_SPRAY | CUTANEOUS | Status: DC | PRN
  Administered 2011-11-01: 1 via TOPICAL
  Filled 2011-11-01: qty 56

## 2011-11-01 MED ORDER — ZOLPIDEM TARTRATE 5 MG PO TABS: 5.0000 mg | ORAL_TABLET | Freq: Every evening | ORAL | Status: DC | PRN

## 2011-11-01 MED ORDER — TETANUS-DIPHTH-ACELL PERTUSSIS 5-2.5-18.5 LF-MCG/0.5 IM SUSP: 0.5000 mL | Freq: Once | INTRAMUSCULAR | Status: DC

## 2011-11-01 MED ORDER — WITCH HAZEL-GLYCERIN EX PADS: 1.0000 "application " | MEDICATED_PAD | CUTANEOUS | Status: DC | PRN

## 2011-11-01 MED ORDER — OXYCODONE-ACETAMINOPHEN 5-325 MG PO TABS
1.0000 | ORAL_TABLET | ORAL | Status: DC | PRN
  Administered 2011-11-02 (×3): 1 via ORAL
  Filled 2011-11-01 (×3): qty 1

## 2011-11-01 MED ORDER — LACTATED RINGERS IV SOLN: 500.0000 mL | INTRAVENOUS | Status: DC | PRN

## 2011-11-01 MED ORDER — IBUPROFEN 600 MG PO TABS: 600.0000 mg | ORAL_TABLET | Freq: Four times a day (QID) | ORAL | Status: DC | PRN

## 2011-11-01 MED ORDER — FENTANYL 2.5 MCG/ML BUPIVACAINE 1/10 % EPIDURAL INFUSION (WH - ANES)
INTRAMUSCULAR | Status: DC | PRN
  Administered 2011-11-01: 14 mL/h via EPIDURAL

## 2011-11-01 MED ORDER — LIDOCAINE HCL (PF) 1 % IJ SOLN
30.0000 mL | INTRAMUSCULAR | Status: DC | PRN
  Filled 2011-11-01: qty 30

## 2011-11-01 MED ORDER — ONDANSETRON HCL 4 MG/2ML IJ SOLN: 4.0000 mg | INTRAMUSCULAR | Status: DC | PRN

## 2011-11-01 MED ORDER — CITRIC ACID-SODIUM CITRATE 334-500 MG/5ML PO SOLN: 30.0000 mL | ORAL | Status: DC | PRN

## 2011-11-01 MED ORDER — OXYTOCIN BOLUS FROM INFUSION
250.0000 mL | Freq: Once | INTRAVENOUS | Status: DC
  Filled 2011-11-01: qty 500

## 2011-11-01 MED ORDER — SENNOSIDES-DOCUSATE SODIUM 8.6-50 MG PO TABS
2.0000 | ORAL_TABLET | Freq: Every day | ORAL | Status: DC
  Administered 2011-11-01 – 2011-11-02 (×2): 2 via ORAL

## 2011-11-01 MED ORDER — PENICILLIN G POTASSIUM 5000000 UNITS IJ SOLR
2.5000 10*6.[IU] | INTRAMUSCULAR | Status: DC
  Filled 2011-11-01 (×4): qty 2.5

## 2011-11-01 MED ORDER — OXYCODONE-ACETAMINOPHEN 5-325 MG PO TABS: 1.0000 | ORAL_TABLET | ORAL | Status: DC | PRN

## 2011-11-01 MED ORDER — PENICILLIN G POTASSIUM 5000000 UNITS IJ SOLR
5.0000 10*6.[IU] | Freq: Once | INTRAVENOUS | Status: AC
  Administered 2011-11-01: 5 10*6.[IU] via INTRAVENOUS
  Filled 2011-11-01: qty 5

## 2011-11-01 MED ORDER — PRENATAL MULTIVITAMIN CH
1.0000 | ORAL_TABLET | Freq: Every day | ORAL | Status: DC
  Filled 2011-11-01 (×2): qty 1

## 2011-11-01 MED ORDER — PHENYLEPHRINE 40 MCG/ML (10ML) SYRINGE FOR IV PUSH (FOR BLOOD PRESSURE SUPPORT)
80.0000 ug | PREFILLED_SYRINGE | INTRAVENOUS | Status: DC | PRN
  Filled 2011-11-01: qty 5

## 2011-11-01 MED ORDER — MEASLES, MUMPS & RUBELLA VAC ~~LOC~~ INJ: 0.5000 mL | INJECTION | Freq: Once | SUBCUTANEOUS | Status: DC

## 2011-11-01 MED ORDER — ACETAMINOPHEN 325 MG PO TABS: 650.0000 mg | ORAL_TABLET | ORAL | Status: DC | PRN

## 2011-11-01 MED ORDER — FLEET ENEMA 7-19 GM/118ML RE ENEM: 1.0000 | ENEMA | RECTAL | Status: DC | PRN

## 2011-11-01 MED ORDER — LANOLIN HYDROUS EX OINT: TOPICAL_OINTMENT | CUTANEOUS | Status: DC | PRN

## 2011-11-01 MED ORDER — MEDROXYPROGESTERONE ACETATE 150 MG/ML IM SUSP: 150.0000 mg | INTRAMUSCULAR | Status: DC | PRN

## 2011-11-01 NOTE — Anesthesia Preprocedure Evaluation (Signed)

## 2011-11-01 NOTE — Anesthesia Procedure Notes (Signed)

## 2011-11-01 NOTE — H&P (Signed)
41 year old G 2 P 1001 at 35 2 presents complaining of contractions since last night and SROM while in the office clear fluid. PNC see [facility] GBBS + Rh Neg AMA  Afebrile Vital signs stable General alert and oriented Lung CTAB Car RRR Abdomen soft and non tender Cervix is 90%/ 3 to 4 -2 Vertex  Impression: SROM LABOR GBBS +  Plan: Admit Epidural Penicillin Pitocin per protocol

## 2011-11-02 LAB — CBC
Hemoglobin: 10.7 g/dL — ABNORMAL LOW (ref 12.0–15.0)
MCH: 30.5 pg (ref 26.0–34.0)
MCHC: 34.1 g/dL (ref 30.0–36.0)
RDW: 13.8 % (ref 11.5–15.5)

## 2011-11-02 MED ORDER — RHO D IMMUNE GLOBULIN 1500 UNIT/2ML IJ SOLN
300.0000 ug | Freq: Once | INTRAMUSCULAR | Status: AC
  Administered 2011-11-02: 300 ug via INTRAMUSCULAR
  Filled 2011-11-02: qty 2

## 2011-11-02 NOTE — Anesthesia Postprocedure Evaluation (Signed)
  Anesthesia Post-op Note  Patient: Tracy Baldwin  Procedure(s) Performed: * No procedures listed *  Patient Location: Mother/Baby  Anesthesia Type: Epidural  Level of Consciousness: awake, alert  and oriented  Airway and Oxygen Therapy: Patient Spontanous Breathing  Post-op Pain: none  Post-op Assessment: Post-op Vital signs reviewed, Patient's Cardiovascular Status Stable, No headache, No backache, No residual numbness and No residual motor weakness  Post-op Vital Signs: Reviewed and stable  Complications: No apparent anesthesia complications

## 2011-11-02 NOTE — Progress Notes (Signed)
Post Partum Day 1 Subjective: no complaints, up ad lib, voiding, tolerating PO and + flatus  Objective: Blood pressure 102/65, pulse 85, temperature 98.3 F (36.8 C), temperature source Oral, resp. rate 18, height 5\' 2"  (1.575 m), weight 73.664 kg (162 lb 6.4 oz), SpO2 99.00%, unknown if currently breastfeeding.  Physical Exam:  General: alert, cooperative and appears stated age Lochia: appropriate Uterine Fundus: firm Incision: healing well DVT Evaluation: No evidence of DVT seen on physical exam.   Basename 11/02/11 0534 11/01/11 1255  HGB 10.7* 13.2  HCT 31.4* 37.9    Assessment/Plan: Plan for discharge tomorrow and Breastfeeding   LOS: 1 day   Tracy Baldwin 11/02/2011, 8:59 AM

## 2011-11-03 LAB — RH IG WORKUP (INCLUDES ABO/RH)
ABO/RH(D): AB NEG
Gestational Age(Wks): 37

## 2011-11-03 MED ORDER — IBUPROFEN 600 MG PO TABS: 600.0000 mg | ORAL_TABLET | Freq: Four times a day (QID) | ORAL | Status: AC

## 2011-11-03 MED ORDER — OXYCODONE-ACETAMINOPHEN 5-325 MG PO TABS: 1.0000 | ORAL_TABLET | ORAL | Status: AC | PRN

## 2011-11-03 NOTE — Progress Notes (Signed)
Post Partum Day 2 Subjective: no complaints, up ad lib, voiding, tolerating PO and + flatus  Objective: Blood pressure 104/68, pulse 93, temperature 98.1 F (36.7 C), temperature source Oral, resp. rate 18, height 5\' 2"  (1.575 m), weight 73.664 kg (162 lb 6.4 oz), SpO2 99.00%, unknown if currently breastfeeding.  Physical Exam:  General: alert, cooperative and appears stated age Lochia: appropriate Uterine Fundus: firm Incision: healing well, no significant drainage, no dehiscence, no significant erythema DVT Evaluation: No evidence of DVT seen on physical exam.   Basename 11/02/11 0534 11/01/11 1255  HGB 10.7* 13.2  HCT 31.4* 37.9    Assessment/Plan: Discharge home and Breastfeeding   LOS: 2 days   Tracy Baldwin L 11/03/2011, 8:10 AM

## 2011-11-03 NOTE — Discharge Summary (Signed)
Obstetric Discharge Summary Reason for Admission: rupture of membranes Prenatal Procedures: none Intrapartum Procedures: spontaneous vaginal delivery Postpartum Procedures: none Complications-Operative and Postpartum: none Hemoglobin  Date Value Range Status  11/02/2011 10.7* 12.0 - 15.0 g/dL Final     DELTA CHECK NOTED     REPEATED TO VERIFY     HCT  Date Value Range Status  11/02/2011 31.4* 36.0 - 46.0 % Final    Physical Exam:  General: alert, cooperative and appears stated age 41: appropriate Uterine Fundus: firm Incision: healing well DVT Evaluation: No evidence of DVT seen on physical exam.  Discharge Diagnoses: Term Pregnancy-delivered  Discharge Information: Date: 11/03/2011 Activity: pelvic rest Diet: routine Medications: Ibuprofen and Percocet Condition: improved Instructions: refer to practice specific booklet Discharge to: home   Newborn Data: Live born female  Birth Weight: 7 lb 6 oz (3345 g) APGAR: 8, 9  Home with mother.  Tracy Baldwin L 11/03/2011, 8:11 AM

## 2012-02-04 ENCOUNTER — Other Ambulatory Visit: Payer: Self-pay | Admitting: Gynecology

## 2012-02-25 ENCOUNTER — Other Ambulatory Visit: Payer: Managed Care, Other (non HMO)

## 2012-03-03 ENCOUNTER — Encounter: Payer: Managed Care, Other (non HMO) | Admitting: Family Medicine

## 2012-03-11 ENCOUNTER — Telehealth: Payer: Self-pay | Admitting: Family Medicine

## 2012-03-11 NOTE — Telephone Encounter (Signed)
Caller: Kayliah/Patient; Patient Name: Tracy Baldwin; PCP: Gershon Crane [facility]); Best Callback Phone Number: [phone]. LMP:  Breastfeeding.  Pt calling if she should get a flu shot. She has a sore throat in the AM and is coughing up green phelgm.  Neckglands swollen and right side mildly tender. Afebrile. Onset 03/03/13.  All emergent symptoms ruled out per 'Sore throat not responding to 7 days of home care'.  See provider 24 hrs.  Pt wants to cancel Flu appt and make an appt on Friday for her symptoms.  Advise caller to call back for a future appt. Canceled flu shot per caller's request.

## 2012-03-13 ENCOUNTER — Ambulatory Visit: Payer: Managed Care, Other (non HMO) | Admitting: Family Medicine

## 2012-05-27 ENCOUNTER — Other Ambulatory Visit: Payer: Self-pay

## 2012-08-18 ENCOUNTER — Other Ambulatory Visit: Payer: Self-pay | Admitting: Gynecology

## 2012-09-17 ENCOUNTER — Telehealth: Payer: Self-pay | Admitting: Family Medicine

## 2012-09-17 NOTE — Telephone Encounter (Signed)
Patient Information:  Caller Name: Londan  Phone: [phone]  Patient: Tracy Baldwin, Tracy Baldwin  Gender: Female  DOB: 07/25/1970  Age: 42 Years  PCP: Gershon Crane [facility])  Pregnant: No  Office Follow Up:  Does the office need to follow up with this patient?: Yes  Instructions For The Office: Patient has appt for labwork 10/01/12 and well visit 10/12/12 krs/can  RN Note:  Patient states she has labwork being drawn 10/01/12 for well visit scheduled 10/12/12.  States has strong close family history of rheumatoid arthritis.  States over past few months has noted increasing joint pain, particularly in feet, but also wrists, hands, and back.  Stopped nursing infant 09/16/12.  Wants labwork drawn to determine possibility of RA or other arthritis.  Triaged for foot pain; advised appt within 2 weeks.  Has appt 10/12/12; declines earlier appt, as has childcare scheduling issues.  Info to office for provider review/lab orders/callback.  May reach patient at [phone].  Symptoms  Reason For Call & Symptoms: labwork request for arthritis  Reviewed Health History In EMR: Yes  Reviewed Medications In EMR: Yes  Reviewed Allergies In EMR: Yes  Reviewed Surgeries / Procedures: Yes  Date of Onset of Symptoms: Unknown OB / GYN:  LMP: Unknown  Guideline(s) Used:  Foot Pain  Disposition Per Guideline:   See Within 2 Weeks in Office  Reason For Disposition Reached:   Mild pain (e.g., does not interfere with normal activities) and present > 7 days  Advice Given:  N/A  Patient Refused Recommendation:  Patient Will Follow Up With Office Later  Patient has appt for labwork 10/01/12 and well visit 10/12/12 krs/can

## 2012-10-01 ENCOUNTER — Other Ambulatory Visit (INDEPENDENT_AMBULATORY_CARE_PROVIDER_SITE_OTHER): Payer: Managed Care, Other (non HMO)

## 2012-10-01 DIAGNOSIS — Z0000 Encounter for general adult medical examination without abnormal findings: Secondary | ICD-10-CM

## 2012-10-01 LAB — BASIC METABOLIC PANEL
CO2: 26 mEq/L (ref 19–32)
Chloride: 102 mEq/L (ref 96–112)
Potassium: 4.6 mEq/L (ref 3.5–5.1)
Sodium: 137 mEq/L (ref 135–145)

## 2012-10-01 LAB — CBC WITH DIFFERENTIAL/PLATELET
Basophils Absolute: 0 10*3/uL (ref 0.0–0.1)
Eosinophils Absolute: 0.1 10*3/uL (ref 0.0–0.7)
Hemoglobin: 13.2 g/dL (ref 12.0–15.0)
Lymphocytes Relative: 28.7 % (ref 12.0–46.0)
Monocytes Relative: 9.5 % (ref 3.0–12.0)
Neutro Abs: 2.3 10*3/uL (ref 1.4–7.7)
Neutrophils Relative %: 59.1 % (ref 43.0–77.0)
RDW: 13.3 % (ref 11.5–14.6)

## 2012-10-01 LAB — HEPATIC FUNCTION PANEL
ALT: 18 U/L (ref 0–35)
AST: 15 U/L (ref 0–37)
Alkaline Phosphatase: 68 U/L (ref 39–117)
Bilirubin, Direct: 0 mg/dL (ref 0.0–0.3)
Total Bilirubin: 0.7 mg/dL (ref 0.3–1.2)
Total Protein: 7.1 g/dL (ref 6.0–8.3)

## 2012-10-01 LAB — POCT URINALYSIS DIPSTICK
Bilirubin, UA: NEGATIVE
Ketones, UA: NEGATIVE
Leukocytes, UA: NEGATIVE
Protein, UA: NEGATIVE
Spec Grav, UA: 1.015
pH, UA: 7.5

## 2012-10-01 LAB — LIPID PANEL
HDL: 64.3 mg/dL (ref >39.00)
LDL Cholesterol: 86 mg/dL (ref 0–99)
VLDL: 11 mg/dL (ref 0.0–40.0)

## 2012-10-01 NOTE — Addendum Note (Signed)
Addended by: Bonnye Fava on: 10/01/2012 10:07 AM   Modules accepted: Orders

## 2012-10-02 LAB — RHEUMATOID FACTOR: Rhuematoid fact SerPl-aCnc: <10 IU/mL (ref <=14)

## 2012-10-02 LAB — SEDIMENTATION RATE: Sed Rate: 6 mm/hr (ref 0–22)

## 2012-10-06 NOTE — Progress Notes (Signed)
Quick Note:  I spoke with pt ______ 

## 2012-10-12 ENCOUNTER — Encounter: Payer: Self-pay | Admitting: Family Medicine

## 2012-10-12 ENCOUNTER — Ambulatory Visit (INDEPENDENT_AMBULATORY_CARE_PROVIDER_SITE_OTHER): Payer: Managed Care, Other (non HMO) | Admitting: Family Medicine

## 2012-10-12 VITALS — BP 104/70 | HR 76 | Temp 98.2°F | Ht 63.0 in | Wt 124.0 lb

## 2012-10-12 DIAGNOSIS — M2550 Pain in unspecified joint: Secondary | ICD-10-CM

## 2012-10-12 DIAGNOSIS — Z0000 Encounter for general adult medical examination without abnormal findings: Secondary | ICD-10-CM

## 2012-10-12 NOTE — Progress Notes (Signed)
  Subjective:    Patient ID: Tracy Baldwin, female    DOB: 04-Dec-1970, 42 y.o.   MRN: [id number]  HPI 42 yr old female for a cpx. She has one complaint today and that is diffuse joint pains that started about 4 months ago. No joint swelling is noted. She delivered her second child last year and she had been breast feeding for 11 months until she stopped 3 weeks ago. No fevers or bowel changes, no rashes. She notes a family hx of arthritis including her father and paternal GM. But apparently no one with rheumatoid arthritis. Her main joints that are affected are the hands, hips, knees and feet. No real spinal issues.    Review of Systems  Constitutional: Negative.   HENT: Negative.   Eyes: Negative.   Respiratory: Negative.   Cardiovascular: Negative.   Gastrointestinal: Negative.   Genitourinary: Negative for dysuria, urgency, frequency, hematuria, flank pain, decreased urine volume, enuresis, difficulty urinating, pelvic pain and dyspareunia.  Musculoskeletal: Negative.   Skin: Negative.   Neurological: Negative.   Psychiatric/Behavioral: Negative.        Objective:   Physical Exam  Constitutional: She is oriented to person, place, and time. She appears well-developed and well-nourished. No distress.  HENT:  Head: Normocephalic and atraumatic.  Right Ear: External ear normal.  Left Ear: External ear normal.  Nose: Nose normal.  Mouth/Throat: Oropharynx is clear and moist. No oropharyngeal exudate.  Eyes: Conjunctivae and EOM are normal. Pupils are equal, round, and reactive to light. No scleral icterus.  Neck: Normal range of motion. Neck supple. No JVD present. No thyromegaly present.  Cardiovascular: Normal rate, regular rhythm, normal heart sounds and intact distal pulses.  Exam reveals no gallop and no friction rub.   No murmur heard. Pulmonary/Chest: Effort normal and breath sounds normal. No respiratory distress. She has no wheezes. She has no rales. She exhibits no  tenderness.  Abdominal: Soft. Bowel sounds are normal. She exhibits no distension and no mass. There is no tenderness. There is no rebound and no guarding.  Musculoskeletal: Normal range of motion. She exhibits no edema and no tenderness.  Her hands are normal with no joint swelling or tenderness  Lymphadenopathy:    She has no cervical adenopathy.  Neurological: She is alert and oriented to person, place, and time. She has normal reflexes. No cranial nerve deficit. She exhibits normal muscle tone. Coordination normal.  Skin: Skin is warm and dry. No rash noted. No erythema.  Psychiatric: She has a normal mood and affect. Her behavior is normal. Judgment and thought content normal.          Assessment & Plan:  Well exam. Check levels for vitamin D and B12. 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 Try Aleve bid. I suggested hot yoga. Recheck prn

## 2012-10-13 MED ORDER — ERGOCALCIFEROL 1.25 MG (50000 UT) PO CAPS: 50000.0000 [IU] | ORAL_CAPSULE | ORAL | Status: DC

## 2012-10-13 NOTE — Progress Notes (Signed)
Quick Note:  I spoke with pt and sent script e-scribe. ______ 

## 2012-10-13 NOTE — Addendum Note (Signed)
Addended by: Aniceto Boss A on: 10/13/2012 02:58 PM   Modules accepted: Orders

## 2012-11-16 ENCOUNTER — Telehealth: Payer: Self-pay | Admitting: Family Medicine

## 2012-11-16 NOTE — Telephone Encounter (Signed)
Called for clarification about Vit D frequency and lab follow up.  When she was contacted by office staff in June, she thought she understood that the Vit D was ordered as monthly so she used it once in June.  Recently, she read the label and saw it specifies to use it weekly.  Called to clarify frequency of use and when to have the the follow up lab.  Per Epic, informed the Vit D2, 50,000 units was ordered weekly with follow up lab for 12 weeks later. Will begin taking it weekly beginning 11/17/15 and schedule follow up lab for 10/14.

## 2013-01-15 ENCOUNTER — Other Ambulatory Visit: Payer: Self-pay

## 2013-01-15 DIAGNOSIS — Z1231 Encounter for screening mammogram for malignant neoplasm of breast: Secondary | ICD-10-CM

## 2013-02-03 ENCOUNTER — Telehealth: Payer: Self-pay | Admitting: Family Medicine

## 2013-02-03 DIAGNOSIS — E559 Vitamin D deficiency, unspecified: Secondary | ICD-10-CM

## 2013-02-03 NOTE — Telephone Encounter (Signed)
Pt states she has finished the vit d regimen of 12 wks and needs to sch labs. pls advise.

## 2013-02-04 NOTE — Telephone Encounter (Signed)
I placed the order.

## 2013-02-05 NOTE — Telephone Encounter (Signed)
Lab order is in computer, can you call pt to schedule lab appointment?

## 2013-02-05 NOTE — Telephone Encounter (Signed)
lmom/kh 

## 2013-02-09 ENCOUNTER — Other Ambulatory Visit: Payer: Managed Care, Other (non HMO)

## 2013-02-09 DIAGNOSIS — E559 Vitamin D deficiency, unspecified: Secondary | ICD-10-CM

## 2013-02-10 LAB — VITAMIN D 25 HYDROXY (VIT D DEFICIENCY, FRACTURES): Vit D, 25-Hydroxy: 34 ng/mL (ref 30–89)

## 2013-02-12 NOTE — Progress Notes (Signed)
Quick Note:  I spoke with pt and also she will recheck level in about 6 months. ______

## 2013-03-30 ENCOUNTER — Ambulatory Visit
Admission: RE | Admit: 2013-03-30 | Discharge: 2013-03-30 | Disposition: A | Discharge status: Home / Self Care | Payer: Managed Care, Other (non HMO) | Source: Ambulatory Visit

## 2013-03-30 DIAGNOSIS — Z1231 Encounter for screening mammogram for malignant neoplasm of breast: Secondary | ICD-10-CM

## 2013-07-21 ENCOUNTER — Other Ambulatory Visit: Payer: Self-pay | Admitting: Obstetrics and Gynecology

## 2013-07-21 DIAGNOSIS — N6310 Unspecified lump in the right breast, unspecified quadrant: Secondary | ICD-10-CM

## 2013-08-03 ENCOUNTER — Ambulatory Visit
Admission: RE | Admit: 2013-08-03 | Discharge: 2013-08-03 | Disposition: A | Discharge status: Home / Self Care | Payer: Managed Care, Other (non HMO) | Source: Ambulatory Visit | Attending: Obstetrics and Gynecology | Admitting: Obstetrics and Gynecology

## 2013-08-03 DIAGNOSIS — N6310 Unspecified lump in the right breast, unspecified quadrant: Secondary | ICD-10-CM

## 2013-10-13 ENCOUNTER — Ambulatory Visit (INDEPENDENT_AMBULATORY_CARE_PROVIDER_SITE_OTHER): Payer: Managed Care, Other (non HMO) | Admitting: Family Medicine

## 2013-10-13 ENCOUNTER — Encounter: Payer: Self-pay | Admitting: Family Medicine

## 2013-10-13 VITALS — BP 101/70 | HR 76 | Temp 99.5°F | Ht 63.0 in | Wt 142.0 lb

## 2013-10-13 DIAGNOSIS — F411 Generalized anxiety disorder: Secondary | ICD-10-CM

## 2013-10-13 DIAGNOSIS — H6120 Impacted cerumen, unspecified ear: Secondary | ICD-10-CM

## 2013-10-13 DIAGNOSIS — E559 Vitamin D deficiency, unspecified: Secondary | ICD-10-CM

## 2013-10-13 MED ORDER — ESCITALOPRAM OXALATE 20 MG PO TABS: 20.0000 mg | ORAL_TABLET | Freq: Every day | ORAL | Status: DC

## 2013-10-13 MED ORDER — ESCITALOPRAM OXALATE 10 MG PO TABS: 10.0000 mg | ORAL_TABLET | Freq: Every day | ORAL | Status: DC

## 2013-10-13 NOTE — Progress Notes (Signed)
   Subjective:    Patient ID: Tracy Baldwin, female    DOB: 05/27/1970, 43 y.o.   MRN: [id number]  HPI Here for 3 days of decreased hearing in the right ear. No pain. She tried to clean it out with a Q Tip to no avail. Also she has been taking Lexapro 10 mg daily for anxiety. This helped a lot for awhile but lately it has not helped as much.    Review of Systems  Constitutional: Negative.   HENT: Positive for hearing loss. Negative for congestion, ear discharge, ear pain and sinus pressure.   Eyes: Negative.        Objective:   Physical Exam  Constitutional: She appears well-developed and well-nourished.  HENT:  Left Ear: External ear normal.  Nose: Nose normal.  Mouth/Throat: Oropharynx is clear and moist.  Right ear canal is full of cerumen   Eyes: Conjunctivae are normal.  Neck: No thyromegaly present.  Lymphadenopathy:    She has no cervical adenopathy.          Assessment & Plan:  The ear canal was cleaned out with irrigation and a speculum. We will increase Lexapro to 20 mg a day.

## 2013-10-13 NOTE — Progress Notes (Signed)
Pre visit review using our clinic review tool, if applicable. No additional management support is needed unless otherwise documented below in the visit note. 

## 2013-10-22 ENCOUNTER — Telehealth: Payer: Self-pay | Admitting: Family Medicine

## 2013-10-22 NOTE — Telephone Encounter (Signed)
Yes she can take 1/2 a tablet a day

## 2013-10-22 NOTE — Telephone Encounter (Signed)
I left a voice message with the below information. 

## 2013-10-22 NOTE — Telephone Encounter (Signed)
Pt said she is taking 20mg  of   escitalopram (LEXAPRO) 20 MG tablet  But  she does not fill right would like to know if she can cut her escitalopram (LEXAPRO) 20 MG tablet in half which will give 10mg    t

## 2013-12-27 ENCOUNTER — Ambulatory Visit (INDEPENDENT_AMBULATORY_CARE_PROVIDER_SITE_OTHER): Payer: Managed Care, Other (non HMO) | Admitting: Family Medicine

## 2013-12-27 ENCOUNTER — Encounter: Payer: Self-pay | Admitting: Family Medicine

## 2013-12-27 VITALS — BP 120/78 | HR 80 | Temp 98.1°F | Ht 63.0 in | Wt 147.0 lb

## 2013-12-27 DIAGNOSIS — J189 Pneumonia, unspecified organism: Secondary | ICD-10-CM

## 2013-12-27 MED ORDER — AZITHROMYCIN 250 MG PO TABS: ORAL_TABLET | ORAL | Status: DC

## 2013-12-27 NOTE — Patient Instructions (Signed)
Antibiotics for 5 days  Community Acquired Pneumonia Pneumonia is an infection of the lungs.  CAUSES Pneumonia may be caused by bacteria or a virus. Usually, these infections are caused by breathing infectious particles into the lungs (respiratory tract). SIGNS AND SYMPTOMS   Cough.  Fever.  Chest pain.  Increased rate of breathing.  Wheezing.  Mucus production. DIAGNOSIS  If you have the common symptoms of pneumonia, your health care provider will typically confirm the diagnosis with a chest X-ray. The X-ray will show an abnormality in the lung (pulmonary infiltrate) if you have pneumonia. Other tests of your blood, urine, or sputum may be done to find the specific cause of your pneumonia. Your health care provider may also do tests (blood gases or pulse oximetry) to see how well your lungs are working. TREATMENT  Some forms of pneumonia may be spread to other people when you cough or sneeze. You may be asked to wear a mask before and during your exam. Pneumonia that is caused by bacteria is treated with antibiotic medicine. Pneumonia that is caused by the influenza virus may be treated with an antiviral medicine. Most other viral infections must run their course. These infections will not respond to antibiotics.  HOME CARE INSTRUCTIONS   Cough suppressants may be used if you are losing too much rest. However, coughing protects you by clearing your lungs. You should avoid using cough suppressants if you can.  Your health care provider may have prescribed medicine if he or she thinks your pneumonia is caused by bacteria or influenza. Finish your medicine even if you start to feel better.  Your health care provider may also prescribe an expectorant. This loosens the mucus to be coughed up.  Take medicines only as directed by your health care provider.  Do not smoke. Smoking is a common cause of bronchitis and can contribute to pneumonia. If you are a smoker and continue to smoke, your  cough may last several weeks after your pneumonia has cleared.  A cold steam vaporizer or humidifier in your room or home may help loosen mucus.  Coughing is often worse at night. Sleeping in a semi-upright position in a recliner or using a couple pillows under your head will help with this.  Get rest as you feel it is needed. Your body will usually let you know when you need to rest. PREVENTION A pneumococcal shot (vaccine) is available to prevent a common bacterial cause of pneumonia. This is usually suggested for:  People over 25 years old.  Patients on chemotherapy.  People with chronic lung problems, such as bronchitis or emphysema.  People with immune system problems. If you are over 65 or have a high risk condition, you may receive the pneumococcal vaccine if you have not received it before. In some countries, a routine influenza vaccine is also recommended. This vaccine can help prevent some cases of pneumonia.You may be offered the influenza vaccine as part of your care. If you smoke, it is time to quit. You may receive instructions on how to stop smoking. Your health care provider can provide medicines and counseling to help you quit. SEEK MEDICAL CARE IF: You have a fever. SEEK IMMEDIATE MEDICAL CARE IF:   Your illness becomes worse. This is especially true if you are elderly or weakened from any other disease.  You cannot control your cough with suppressants and are losing sleep.  You begin coughing up blood.  You develop pain which is getting worse or is uncontrolled  with medicines.  Any of the symptoms which initially brought you in for treatment are getting worse rather than better.  You develop shortness of breath or chest pain. MAKE SURE YOU:   Understand these instructions.  Will watch your condition.  Will get help right away if you are not doing well or get worse. Document Released: 04/29/2005 Document Revised: 09/13/2013 Document Reviewed:  07/19/2010 [facility] LP[facility] Patient Information 2015 [employer], [place]. This information is not intended to replace advice given to you by your health care provider. Make sure you discuss any questions you have with your health care provider.

## 2013-12-27 NOTE — Progress Notes (Signed)
  Tracy ConchStephen Shaunie Boehm, MD Phone: [phone]  Subjective:   Tracy PeltonHeather G Baldwin is a 43 y.o. year old very pleasant female patient who presents with the following:  Cough/congestion Since 8/6. Productive of green/yellow sputum. Head feels clear. Feels like in the back of the throat and deeper. Mild upper chest discomfort/tightness. Symptoms gradually worsening.  Low energy, napping more. More likely to cough after inspiration. Daughters and husband with similar cough starting a few days ago. ROS- no fevers/chills. No weight loss. No shortness of breath  Past Medical History- anxiety, history sinusitis, history seasonal allergies, hx heart murmur as a child, no history lung disease  Medications- reviewed and updated Current Outpatient Prescriptions  Medication Sig Dispense Refill  . escitalopram (LEXAPRO) 20 MG tablet Take 1 tablet (20 mg total) by mouth daily.  30 tablet  11  . Norethin-Eth Estrad-Fe Biphas (LO LOESTRIN FE PO) Take by mouth.      . Nutritional Supplements (JUICE PLUS FIBRE PO) Take by mouth.       No current facility-administered medications for this visit.   Objective: BP 120/78  Pulse 80  Temp(Src) 98.1 F (36.7 C)  Ht 5\' 3"  (1.6 m)  Wt 147 lb (66.679 kg)  BMI 26.05 kg/m2 Gen: NAD, resting comfortably on table HEENT: very mild congestion, oropharynx normal, TM normal. Mucus membranes moist CV: RRR no murmurs rubs or gallops Lungs: Left mid lung field with crackles noted. Crackles extend into midaxillary line on left. Right lung field clear as well as left upper lung field.  Ext: no edema or swelling  Assessment/Plan:  Community acquired pneumonia (suspect left middle lobe) Discussed CXR vs. Empiric x-ray based off of lung exam findings. Ultimately joint decision made to treat. Azithromycin x 5 days. Follow up if no improvement in 1 week or if worsening.   Meds ordered this encounter  Medications  . azithromycin (ZITHROMAX) 250 MG tablet    Sig: Take 2 pills  day 1, then 1 pill until finished    Dispense:  6 tablet    Refill:  0

## 2014-01-12 ENCOUNTER — Other Ambulatory Visit: Payer: Managed Care, Other (non HMO)

## 2014-01-12 ENCOUNTER — Other Ambulatory Visit (INDEPENDENT_AMBULATORY_CARE_PROVIDER_SITE_OTHER): Payer: Managed Care, Other (non HMO)

## 2014-01-12 DIAGNOSIS — Z0000 Encounter for general adult medical examination without abnormal findings: Secondary | ICD-10-CM

## 2014-01-12 DIAGNOSIS — E559 Vitamin D deficiency, unspecified: Secondary | ICD-10-CM

## 2014-01-12 LAB — POCT URINALYSIS DIPSTICK
BILIRUBIN UA: NEGATIVE
GLUCOSE UA: NEGATIVE
KETONES UA: NEGATIVE
NITRITE UA: NEGATIVE
Protein, UA: NEGATIVE
RBC UA: NEGATIVE
SPEC GRAV UA: 1.015
Urobilinogen, UA: 0.2
pH, UA: 6.5

## 2014-01-12 LAB — BASIC METABOLIC PANEL
BUN: 16 mg/dL (ref 6–23)
CO2: 27 mEq/L (ref 19–32)
Calcium: 9.3 mg/dL (ref 8.4–10.5)
Chloride: 102 mEq/L (ref 96–112)
Creatinine, Ser: 0.6 mg/dL (ref 0.4–1.2)
GFR: 111.73 mL/min (ref >60.00)
Glucose, Bld: 97 mg/dL (ref 70–99)
Potassium: 4 mEq/L (ref 3.5–5.1)
SODIUM: 138 meq/L (ref 135–145)

## 2014-01-12 LAB — CBC WITH DIFFERENTIAL/PLATELET
BASOS ABS: 0 10*3/uL (ref 0.0–0.1)
Basophils Relative: 0.4 % (ref 0.0–3.0)
Eosinophils Absolute: 0.1 10*3/uL (ref 0.0–0.7)
Eosinophils Relative: 1.9 % (ref 0.0–5.0)
HCT: 37 % (ref 36.0–46.0)
Hemoglobin: 12.5 g/dL (ref 12.0–15.0)
LYMPHS PCT: 29.1 % (ref 12.0–46.0)
Lymphs Abs: 1.4 10*3/uL (ref 0.7–4.0)
MCHC: 33.7 g/dL (ref 30.0–36.0)
MCV: 90.2 fl (ref 78.0–100.0)
MONOS PCT: 8.2 % (ref 3.0–12.0)
Monocytes Absolute: 0.4 10*3/uL (ref 0.1–1.0)
Neutro Abs: 2.8 10*3/uL (ref 1.4–7.7)
Neutrophils Relative %: 60.4 % (ref 43.0–77.0)
PLATELETS: 211 10*3/uL (ref 150.0–400.0)
RBC: 4.11 Mil/uL (ref 3.87–5.11)
RDW: 14.1 % (ref 11.5–15.5)
WBC: 4.7 10*3/uL (ref 4.0–10.5)

## 2014-01-12 LAB — LIPID PANEL
CHOL/HDL RATIO: 3
Cholesterol: 176 mg/dL (ref 0–200)
HDL: 56.3 mg/dL (ref >39.00)
LDL CALC: 97 mg/dL (ref 0–99)
NonHDL: 119.7
TRIGLYCERIDES: 116 mg/dL (ref 0.0–149.0)
VLDL: 23.2 mg/dL (ref 0.0–40.0)

## 2014-01-12 LAB — HEPATIC FUNCTION PANEL
ALK PHOS: 44 U/L (ref 39–117)
ALT: 17 U/L (ref 0–35)
AST: 18 U/L (ref 0–37)
Albumin: 3.8 g/dL (ref 3.5–5.2)
BILIRUBIN DIRECT: 0 mg/dL (ref 0.0–0.3)
BILIRUBIN TOTAL: 0.7 mg/dL (ref 0.2–1.2)
Total Protein: 7.2 g/dL (ref 6.0–8.3)

## 2014-01-12 LAB — TSH: TSH: 1.15 u[IU]/mL (ref 0.35–4.50)

## 2014-01-13 LAB — VITAMIN D 25 HYDROXY (VIT D DEFICIENCY, FRACTURES): Vit D, 25-Hydroxy: 45 ng/mL (ref 30–89)

## 2014-01-19 ENCOUNTER — Encounter: Payer: Self-pay | Admitting: Family Medicine

## 2014-01-19 ENCOUNTER — Ambulatory Visit (INDEPENDENT_AMBULATORY_CARE_PROVIDER_SITE_OTHER): Payer: Managed Care, Other (non HMO) | Admitting: Family Medicine

## 2014-01-19 VITALS — BP 103/67 | HR 70 | Temp 99.2°F | Ht 62.75 in | Wt 150.0 lb

## 2014-01-19 DIAGNOSIS — R251 Tremor, unspecified: Secondary | ICD-10-CM

## 2014-01-19 DIAGNOSIS — R259 Unspecified abnormal involuntary movements: Secondary | ICD-10-CM

## 2014-01-19 DIAGNOSIS — Z0000 Encounter for general adult medical examination without abnormal findings: Secondary | ICD-10-CM

## 2014-01-19 NOTE — Progress Notes (Signed)
Pre visit review using our clinic review tool, if applicable. No additional management support is needed unless otherwise documented below in the visit note. 

## 2014-01-19 NOTE — Progress Notes (Signed)
   Subjective:    Patient ID: Tracy Baldwin, female    DOB: [birth year], 43 y.o.   MRN: [id number]  HPI 43 yr old female for a cpx. She feels well but has a few questions. She wonders if she could be at the beginnings of menopause. Over the past year she has put on 35 lbs of weight for no particular reason and she has had some hot flashes. Her cycles are still regular. She sees Dr. Rana Snare soon for her GYN exam. She was treated recently for a left sided pneumonia and she seems to have totally recovered from that. Finally she says that her hands started "shaking" a few months ago and that this comes and goes. No numbness or weakness. She notes that her father and her paternal aunt have tremors that started while they were in their 12s.    Review of Systems  Constitutional: Negative.   HENT: Negative.   Eyes: Negative.   Respiratory: Negative.   Cardiovascular: Negative.   Gastrointestinal: Negative.   Genitourinary: Negative for dysuria, urgency, frequency, hematuria, flank pain, decreased urine volume, enuresis, difficulty urinating, pelvic pain and dyspareunia.  Musculoskeletal: Negative.   Skin: Negative.   Neurological: Positive for tremors. Negative for dizziness, seizures, syncope, facial asymmetry, speech difficulty, weakness, light-headedness, numbness and headaches.  Psychiatric/Behavioral: Negative.        Objective:   Physical Exam  Constitutional: She is oriented to person, place, and time. She appears well-developed and well-nourished. No distress.  HENT:  Head: Normocephalic and atraumatic.  Right Ear: External ear normal.  Left Ear: External ear normal.  Nose: Nose normal.  Mouth/Throat: Oropharynx is clear and moist. No oropharyngeal exudate.  Eyes: Conjunctivae and EOM are normal. Pupils are equal, round, and reactive to light. No scleral icterus.  Neck: Normal range of motion. Neck supple. No JVD present. No thyromegaly present.  Cardiovascular: Normal rate,  regular rhythm, normal heart sounds and intact distal pulses.  Exam reveals no gallop and no friction rub.   No murmur heard. Pulmonary/Chest: Effort normal and breath sounds normal. No respiratory distress. She has no wheezes. She has no rales. She exhibits no tenderness.  Abdominal: Soft. Bowel sounds are normal. She exhibits no distension and no mass. There is no tenderness. There is no rebound and no guarding.  Musculoskeletal: Normal range of motion. She exhibits no edema and no tenderness.  Lymphadenopathy:    She has no cervical adenopathy.  Neurological: She is alert and oriented to person, place, and time. She has normal reflexes. No cranial nerve deficit. She exhibits normal muscle tone. Coordination normal.  She has subtle resting tremors in both hands that disappear with intentional movements  Skin: Skin is warm and dry. No rash noted. No erythema.  Psychiatric: She has a normal mood and affect. Her behavior is normal. Judgment and thought content normal.          Assessment & Plan:  Well exam. She is probably perimenopausal and she will discuss this with Dr. Rana Snare. She has recovered from pneumonia. She has the beginnings of a benign essential tremor. We will observe this for now.

## 2014-03-01 ENCOUNTER — Other Ambulatory Visit: Payer: Self-pay | Admitting: Obstetrics and Gynecology

## 2014-03-02 LAB — CYTOLOGY - PAP

## 2014-03-14 ENCOUNTER — Encounter: Payer: Self-pay | Admitting: Family Medicine

## 2014-03-24 ENCOUNTER — Encounter: Payer: Self-pay | Admitting: Family Medicine

## 2014-03-24 ENCOUNTER — Ambulatory Visit (INDEPENDENT_AMBULATORY_CARE_PROVIDER_SITE_OTHER): Payer: Managed Care, Other (non HMO) | Admitting: Family Medicine

## 2014-03-24 ENCOUNTER — Telehealth: Payer: Self-pay | Admitting: Family Medicine

## 2014-03-24 VITALS — BP 111/70 | HR 72 | Temp 98.6°F | Ht 62.75 in | Wt 156.0 lb

## 2014-03-24 DIAGNOSIS — S060X0A Concussion without loss of consciousness, initial encounter: Secondary | ICD-10-CM

## 2014-03-24 DIAGNOSIS — G44309 Post-traumatic headache, unspecified, not intractable: Secondary | ICD-10-CM

## 2014-03-24 MED ORDER — AZITHROMYCIN 250 MG PO TABS: ORAL_TABLET | ORAL | Status: DC

## 2014-03-24 NOTE — Telephone Encounter (Signed)
Noted  

## 2014-03-24 NOTE — Telephone Encounter (Signed)
Error pt changed mind sent to triage

## 2014-03-24 NOTE — Progress Notes (Signed)
Pre visit review using our clinic review tool, if applicable. No additional management support is needed unless otherwise documented below in the visit note. 

## 2014-03-24 NOTE — Progress Notes (Signed)
   Subjective:    Patient ID: Tracy PeltonHeather G Awwad, female    DOB: [birth year], 43 y.o.   MRN: [id number]  HPI Here for 2 problems. First for 2 weeks she has had sinus pressure, PND, and ST. No cough or fever. Then on 03-21-14 while walking across a parking lot in a heavy rain she slipped and fell, striking her left forehead on the asphalt. She did not lose consciousness but she was stunned and disoriented, and she had to sit on the asphalt a few minutes before she could stand up. Since then she has had a dull bifrontal headache. No nausea or difficulty walking or blurred vision. No memory or speech problems.    Review of Systems  Constitutional: Negative.   HENT: Positive for congestion, postnasal drip and sinus pressure.   Eyes: Negative.   Respiratory: Negative.   Neurological: Positive for headaches. Negative for dizziness, tremors, seizures, syncope, facial asymmetry, speech difficulty, weakness, light-headedness and numbness.       Objective:   Physical Exam  Constitutional: She is oriented to person, place, and time. She appears well-developed and well-nourished. No distress.  HENT:  Head: Normocephalic and atraumatic.  Right Ear: External ear normal.  Left Ear: External ear normal.  Nose: Nose normal.  Mouth/Throat: Oropharynx is clear and moist.  Eyes: Conjunctivae and EOM are normal. Pupils are equal, round, and reactive to light.  Neck: Normal range of motion. Neck supple.  Pulmonary/Chest: Effort normal and breath sounds normal.  Lymphadenopathy:    She has no cervical adenopathy.  Neurological: She is alert and oriented to person, place, and time. She has normal reflexes. No cranial nerve deficit. She exhibits normal muscle tone. Coordination normal.          Assessment & Plan:  She has had a mild concussion, and we will set her up for a head CT to rule out intracranial bleeding. She will rest and follow up if she develops any neurologic deficits. Treat the  sinusitis with a Zpack.

## 2014-03-24 NOTE — Telephone Encounter (Signed)
Patient Information:  Caller Name: Herbert SetaHeather  Phone: [phone]  Patient: Tracy Baldwin, Tracy Baldwin  Gender: Female  DOB: Mar 03, 1971  Age: 43 Years  PCP: Gershon CraneFry, [facility](Family Practice)  Pregnant: No  Office Follow Up:  Does the office need to follow up with this patient?: No  Instructions For The Office: N/A   Symptoms  Reason For Call & Symptoms: Patient reports she fell on concrete and hit her head 03/21/14.  She continues to have headaches and has been feeling sleepy.  Denies loss of consciousness. Emergent symptoms ruled out.  See Today or Tomorrowper Head Injury guideline due to After 3 days nad headache persists.  Reviewed Health History In EMR: Yes  Reviewed Medications In EMR: Yes  Reviewed Allergies In EMR: Yes  Reviewed Surgeries / Procedures: Yes  Date of Onset of Symptoms: 03/21/2014  Treatments Tried: Naps daily  Treatments Tried Worked: Yes OB / GYN:  LMP: 02/23/2014  Guideline(s) Used:  Head Injury  Disposition Per Guideline:   See Today or Tomorrow in Office  Reason For Disposition Reached:   After 3 days and headache persists  Advice Given:  Reassurance - Direct Blow (Contusion, Bruise)  This sounds like a scalp injury rather than a brain injury or concussion. Treatment at home should be safe. A direct blow to your scalp can cause a contusion. Contusion is the medical term for bruise.  Symptoms are mild pain, swelling, and/or bruising. Sometimes there can also be mild dizziness and nausea.  Apply Heat to the Area:  Beginning 48 hours after an injury, apply a warm washcloth or heating pad for 10 minutes three times a day.  This will help increase blood flow and improve healing.  Expected Course:  Most head trauma only causes an injury to the scalp. Pain, swelling, and bruising usually start to get better 2 to 3 days after an injury.  Swelling most often is gone after 1 week.  It may take 2 weeks for pain and tenderness of the injured area to go away.  Call  Back If:  Severe headache  Extremity weakness or numbness occurs  Slurred speech or blurred vision occurs  Vomiting occurs  You become worse.  Patient Will Follow Care Advice:  YES  Appointment Scheduled:  03/24/2014 10:30:00 Appointment Scheduled Provider:  Gershon CraneFry, [facility](Family Practice)

## 2014-04-04 ENCOUNTER — Telehealth: Payer: Self-pay | Admitting: Family Medicine

## 2014-04-04 NOTE — Telephone Encounter (Signed)
Tell her that her wonderful insurance company has denied covering the head CT I ordered. Ask her how she is doing. If she is still having headaches after she fell, I will refer her to Neurology to evaluate.

## 2014-04-04 NOTE — Telephone Encounter (Signed)
I spoke with pt and she is still having some headaches, however she is feeling better and does not think that she needs a referral.

## 2014-04-04 NOTE — Telephone Encounter (Signed)
Pt insurance denied the request for  CT HEAD W/O CONTRAST-03/25/2014 Denied 03/27/2014  Informed pt that insurance denied the request . procedure is not scheduled please advise , also sent a copy of the denial to MD

## 2014-04-05 NOTE — Telephone Encounter (Signed)
That's good to hear !

## 2014-04-21 ENCOUNTER — Other Ambulatory Visit: Payer: Self-pay

## 2014-04-21 DIAGNOSIS — Z1231 Encounter for screening mammogram for malignant neoplasm of breast: Secondary | ICD-10-CM

## 2014-05-02 ENCOUNTER — Encounter: Payer: Self-pay | Admitting: Family Medicine

## 2014-05-02 ENCOUNTER — Ambulatory Visit (INDEPENDENT_AMBULATORY_CARE_PROVIDER_SITE_OTHER): Payer: Managed Care, Other (non HMO) | Admitting: Family Medicine

## 2014-05-02 VITALS — BP 92/64 | HR 93 | Temp 98.8°F | Ht 62.75 in | Wt 148.0 lb

## 2014-05-02 DIAGNOSIS — S93401A Sprain of unspecified ligament of right ankle, initial encounter: Secondary | ICD-10-CM

## 2014-05-02 DIAGNOSIS — J0100 Acute maxillary sinusitis, unspecified: Secondary | ICD-10-CM

## 2014-05-02 MED ORDER — AMOXICILLIN-POT CLAVULANATE 875-125 MG PO TABS: 1.0000 | ORAL_TABLET | Freq: Two times a day (BID) | ORAL | Status: DC

## 2014-05-02 NOTE — Progress Notes (Signed)
Pre visit review using our clinic review tool, if applicable. No additional management support is needed unless otherwise documented below in the visit note. 

## 2014-05-02 NOTE — Progress Notes (Signed)
   Subjective:    Patient ID: Tracy PeltonHeather G Jessop, female    DOB: 08-Oct-1970, 43 y.o.   MRN: [id number]  HPI Here for 2 things. First she asks me to check her right ankle. About 2 weeks ago she was standing on a stool to clean out a closet when she lost her balance and fell. She had pain and minimal swelling around the right lateral ankle, but the swelling cleared very quickly. No ecchymosis was seen. The area has remained painful ever since however, especially when walking on it. Also for 5 days she has had sinus pressure, PND, ST, and a dry cough. On Mucinex.   Review of Systems  Constitutional: Negative.   HENT: Positive for congestion, postnasal drip and sinus pressure.   Eyes: Negative.   Respiratory: Positive for cough. Negative for chest tightness and shortness of breath.   Musculoskeletal: Positive for arthralgias.       Objective:   Physical Exam  Constitutional: She appears well-developed and well-nourished.  She walks normally   HENT:  Right Ear: External ear normal.  Left Ear: External ear normal.  Nose: Nose normal.  Mouth/Throat: Oropharynx is clear and moist.  Eyes: Conjunctivae are normal.  Pulmonary/Chest: Effort normal and breath sounds normal. No respiratory distress. She has no wheezes. She has no rales.  Voice is hoarse  Musculoskeletal:  The right fibula is quite tender about 4 cm proximal to the malleolus. No swelling, no ecchymosis, full ROM. No crepitus   Lymphadenopathy:    She has no cervical adenopathy.          Assessment & Plan:  Treat the sinusitis with Levaquin. She may have a stress fracture to the fibula so we will set her up for Xrays.

## 2014-05-04 ENCOUNTER — Ambulatory Visit (INDEPENDENT_AMBULATORY_CARE_PROVIDER_SITE_OTHER)
Admission: RE | Admit: 2014-05-04 | Discharge: 2014-05-04 | Disposition: A | Discharge status: Home / Self Care | Payer: Managed Care, Other (non HMO) | Source: Ambulatory Visit | Attending: Family Medicine | Admitting: Family Medicine

## 2014-05-04 DIAGNOSIS — S93401A Sprain of unspecified ligament of right ankle, initial encounter: Secondary | ICD-10-CM

## 2014-05-19 ENCOUNTER — Ambulatory Visit
Admission: RE | Admit: 2014-05-19 | Discharge: 2014-05-19 | Disposition: A | Discharge status: Home / Self Care | Payer: Managed Care, Other (non HMO) | Source: Ambulatory Visit

## 2014-05-19 DIAGNOSIS — Z1231 Encounter for screening mammogram for malignant neoplasm of breast: Secondary | ICD-10-CM

## 2014-06-16 ENCOUNTER — Ambulatory Visit (INDEPENDENT_AMBULATORY_CARE_PROVIDER_SITE_OTHER): Payer: Managed Care, Other (non HMO) | Admitting: Family Medicine

## 2014-06-16 ENCOUNTER — Encounter: Payer: Self-pay | Admitting: Family Medicine

## 2014-06-16 DIAGNOSIS — Z23 Encounter for immunization: Secondary | ICD-10-CM

## 2014-06-16 DIAGNOSIS — M25571 Pain in right ankle and joints of right foot: Secondary | ICD-10-CM

## 2014-06-16 NOTE — Progress Notes (Signed)
Pre visit review using our clinic review tool, if applicable. No additional management support is needed unless otherwise documented below in the visit note. 

## 2014-06-16 NOTE — Addendum Note (Signed)
Addended by: Kern ReapVEREEN, Khamiya Varin B on: 06/16/2014 10:28 AM   Modules accepted: Orders

## 2014-06-16 NOTE — Progress Notes (Signed)
   Subjective:    Patient ID: Tracy PeltonHeather G Omara, female    DOB: 16-Apr-1971, 44 y.o.   MRN: [id number]  HPI Here for continued pain in the right ankle after an injury 2 months ago when she feel off a stool. She has had cool laser treatments to the ankle at a chiropractor's office with mixed results. She had Xrays which were unremarkable.    Review of Systems  Constitutional: Negative.   Musculoskeletal: Positive for arthralgias.       Objective:   Physical Exam  Constitutional: She appears well-developed and well-nourished.  Musculoskeletal:  She has a tender lump on the right lateral malleolus, full ROM           Assessment & Plan:  We will refer her to Orthopedics

## 2014-10-23 ENCOUNTER — Other Ambulatory Visit: Payer: Self-pay | Admitting: Family Medicine

## 2015-04-03 ENCOUNTER — Ambulatory Visit (INDEPENDENT_AMBULATORY_CARE_PROVIDER_SITE_OTHER): Payer: Managed Care, Other (non HMO) | Admitting: Family Medicine

## 2015-04-03 ENCOUNTER — Encounter: Payer: Self-pay | Admitting: Family Medicine

## 2015-04-03 VITALS — BP 103/69 | HR 71 | Temp 98.7°F | Ht 62.75 in | Wt 141.0 lb

## 2015-04-03 DIAGNOSIS — J019 Acute sinusitis, unspecified: Secondary | ICD-10-CM

## 2015-04-03 MED ORDER — AMOXICILLIN-POT CLAVULANATE 875-125 MG PO TABS: 1.0000 | ORAL_TABLET | Freq: Two times a day (BID) | ORAL | Status: DC

## 2015-04-03 NOTE — Progress Notes (Signed)
Pre visit review using our clinic review tool, if applicable. No additional management support is needed unless otherwise documented below in the visit note. 

## 2015-04-04 ENCOUNTER — Encounter: Payer: Self-pay | Admitting: Family Medicine

## 2015-04-04 NOTE — Progress Notes (Signed)
   Subjective:    Patient ID: Teresa PeltonHeather G Stahly, female    DOB: 1971-04-26, 44 y.o.   MRN: [id number]  HPI Here for 5 weeks of sinus pressure, PND, HA, and ST. No cough. On Mucinex .   Review of Systems  Constitutional: Negative.   HENT: Positive for congestion, ear pain, postnasal drip, sinus pressure and sore throat.   Eyes: Negative.   Respiratory: Negative.        Objective:   Physical Exam  Constitutional: She appears well-developed and well-nourished.  HENT:  Right Ear: External ear normal.  Left Ear: External ear normal.  Nose: Nose normal.  Mouth/Throat: Oropharynx is clear and moist.  Eyes: Conjunctivae are normal.  Cardiovascular: Normal rate, regular rhythm, normal heart sounds and intact distal pulses.   Pulmonary/Chest: Effort normal and breath sounds normal.  Lymphadenopathy:    She has no cervical adenopathy.          Assessment & Plan:  Sinusitis, treat with Augmentin.

## 2015-05-02 ENCOUNTER — Ambulatory Visit (INDEPENDENT_AMBULATORY_CARE_PROVIDER_SITE_OTHER): Payer: Managed Care, Other (non HMO) | Admitting: Family Medicine

## 2015-05-02 ENCOUNTER — Encounter: Payer: Self-pay | Admitting: Family Medicine

## 2015-05-02 VITALS — BP 98/68 | HR 137 | Temp 99.5°F

## 2015-05-02 DIAGNOSIS — R509 Fever, unspecified: Secondary | ICD-10-CM | POA: Diagnosis not present

## 2015-05-02 DIAGNOSIS — B349 Viral infection, unspecified: Secondary | ICD-10-CM | POA: Diagnosis not present

## 2015-05-02 LAB — POCT INFLUENZA A/B
Influenza A, POC: NEGATIVE
Influenza B, POC: NEGATIVE

## 2015-05-02 MED ORDER — OSELTAMIVIR PHOSPHATE 75 MG PO CAPS: 75.0000 mg | ORAL_CAPSULE | Freq: Two times a day (BID) | ORAL | Status: DC

## 2015-05-02 NOTE — Progress Notes (Signed)
   Subjective:    Patient ID: Tracy Baldwin, female    DOB: 1970-08-14, 44 y.o.   MRN: [id number]  HPI Here for the sudden onset this morning of fever, body aches, HA, nausea without vomiting, and a dry cough. She was treated for a sinusitis here last month with Augmentin, and this resolved.    Review of Systems  Constitutional: Positive for fever.  HENT: Positive for congestion. Negative for ear pain and postnasal drip.   Eyes: Negative.   Respiratory: Positive for cough.   Cardiovascular: Negative.   Gastrointestinal: Positive for nausea. Negative for vomiting, abdominal pain, diarrhea, constipation, blood in stool, abdominal distention and anal bleeding.  Musculoskeletal: Positive for myalgias.       Objective:   Physical Exam  Constitutional:  Very ill appearing   HENT:  Right Ear: External ear normal.  Left Ear: External ear normal.  Nose: Nose normal.  Mouth/Throat: Oropharynx is clear and moist.  Eyes: Conjunctivae are normal.  Neck: No thyromegaly present.  Cardiovascular: Normal rate, regular rhythm, normal heart sounds and intact distal pulses.   Pulmonary/Chest: Effort normal and breath sounds normal.  Abdominal: Soft. Bowel sounds are normal. She exhibits no distension and no mass. There is no tenderness. There is no rebound and no guarding.  Lymphadenopathy:    She has no cervical adenopathy.          Assessment & Plan:  Influenza, treat with Tamiflu for 5 days. Drink fluids, and take Advil ptn.

## 2015-05-02 NOTE — Progress Notes (Signed)
Pre visit review using our clinic review tool, if applicable. No additional management support is needed unless otherwise documented below in the visit note. Pt declined to weigh 

## 2015-05-05 ENCOUNTER — Other Ambulatory Visit: Payer: Self-pay

## 2015-05-05 DIAGNOSIS — Z1231 Encounter for screening mammogram for malignant neoplasm of breast: Secondary | ICD-10-CM

## 2015-06-02 ENCOUNTER — Ambulatory Visit
Admission: RE | Admit: 2015-06-02 | Discharge: 2015-06-02 | Disposition: A | Discharge status: Home / Self Care | Payer: Managed Care, Other (non HMO) | Source: Ambulatory Visit

## 2015-06-02 DIAGNOSIS — Z1231 Encounter for screening mammogram for malignant neoplasm of breast: Secondary | ICD-10-CM

## 2015-11-29 ENCOUNTER — Other Ambulatory Visit: Payer: Self-pay | Admitting: Family Medicine

## 2016-01-23 ENCOUNTER — Encounter: Payer: Self-pay | Admitting: Family Medicine

## 2016-01-23 ENCOUNTER — Ambulatory Visit (INDEPENDENT_AMBULATORY_CARE_PROVIDER_SITE_OTHER): Payer: Managed Care, Other (non HMO) | Admitting: Family Medicine

## 2016-01-23 VITALS — BP 110/72 | HR 82 | Temp 98.2°F | Ht 63.0 in | Wt 153.2 lb

## 2016-01-23 DIAGNOSIS — F411 Generalized anxiety disorder: Secondary | ICD-10-CM

## 2016-01-23 MED ORDER — DULOXETINE HCL 30 MG PO CPEP: 30.0000 mg | ORAL_CAPSULE | Freq: Every day | ORAL | 3 refills | Status: DC

## 2016-01-23 NOTE — Progress Notes (Signed)
   Subjective:    Patient ID: Tracy Baldwin, female    DOB: [birth year], [age] y.o.   MRN: [id number]  HPI Here to ask advice about some recent test results and to discuss her anxiety. She has been on Lexapro for several years and this was helpful for her at first. We have increased the dose from 10 mg daily to 20 mg, but she still has times where she cannot relax and she gets very irritable. She loses patience with her children and loses her temper quickly. She denies sadness or hopelessness. Her appetite and sleep are good. She has been seeing a chiropractor named Joie BimlerJoe Draper for a year or so and has been taking numerous supplements that he has recommended to her. She has had numerous and detailed lab work and she asks me if the supplements are a good idea. She does not exercise much.    Review of Systems  Constitutional: Negative.   Respiratory: Negative.   Cardiovascular: Negative.   Gastrointestinal: Negative.   Neurological: Negative.   Psychiatric/Behavioral: Positive for agitation and decreased concentration. Negative for confusion, dysphoric mood, hallucinations, self-injury, sleep disturbance and suicidal ideas. The patient is nervous/anxious.        Objective:   Physical Exam  Constitutional: She is oriented to person, place, and time. She appears well-developed and well-nourished.  Neck: No thyromegaly present.  Cardiovascular: Normal rate, regular rhythm, normal heart sounds and intact distal pulses.   Pulmonary/Chest: Effort normal and breath sounds normal.  Lymphadenopathy:    She has no cervical adenopathy.  Neurological: She is alert and oriented to person, place, and time.  Psychiatric: She has a normal mood and affect. Her behavior is normal. Judgment and thought content normal.          Assessment & Plan:  I told her that all her labs look good and she seems to be healthy. I do not think she needs to spend so much money out of pocket every month for all the  supplements as long as she eats a well balanced diet. I advised her to get regular exercise. For the anxiety we will switch from Lexapro to Cymbalta 30 mg daily. Recheck in 3 weeks.  Nelwyn SalisburyFRY,STEPHEN A, MD

## 2016-02-26 ENCOUNTER — Telehealth: Payer: Self-pay | Admitting: Family Medicine

## 2016-02-26 NOTE — Telephone Encounter (Signed)
It sounds like a broken blood vessel which is very common and benign. I doubt this has anything to do with the Cymbalta. We can take a look at it if she wants an OV tomorrow.

## 2016-02-26 NOTE — Telephone Encounter (Signed)
I called the pt and informed her of the message below and she stated the triage nurse already scheduled an appt for tomorrow at 10:30am.

## 2016-02-26 NOTE — Telephone Encounter (Signed)
TELEPHONE ADVICE RECORD [facility]  Patient Name: Freddie ApleyHEATHER Lando  DOB: 01/11/1971    Initial Comment Caller states she may be having a reaction to Cymbalta she has been on for about a Month, eye balls vessels are popping.    Nurse Assessment  Nurse: Odis LusterBowers, RN, Bjorn Loserhonda Date/Time (Eastern Time): 02/26/2016 9:36:02 AM  Confirm and document reason for call. If symptomatic, describe symptoms. You must click the next button to save text entered. ---Caller states she may be having a reaction to Cymbalta she has been on for about a Month, eye balls vessels are popping. One eye is red.  Has the patient traveled out of the country within the last 30 days? ---Not Applicable  Does the patient have any new or worsening symptoms? ---Yes  Will a triage be completed? ---Yes  Related visit to physician within the last 2 weeks? ---No  Does the PT have any chronic conditions? (i.e. diabetes, asthma, etc.) ---Yes  List chronic conditions. ---anxiety;  Is the patient pregnant or possibly pregnant? (Ask all females between the ages of 6412-55) ---No  Is this a behavioral health or substance abuse call? ---No     Guidelines    Guideline Title Affirmed Question Affirmed Notes       Final Disposition User

## 2016-02-27 ENCOUNTER — Ambulatory Visit (INDEPENDENT_AMBULATORY_CARE_PROVIDER_SITE_OTHER): Payer: Managed Care, Other (non HMO) | Admitting: Family Medicine

## 2016-02-27 ENCOUNTER — Encounter: Payer: Self-pay | Admitting: Family Medicine

## 2016-02-27 VITALS — BP 111/72 | HR 77 | Temp 98.4°F | Ht 63.0 in | Wt 158.0 lb

## 2016-02-27 DIAGNOSIS — M7989 Other specified soft tissue disorders: Secondary | ICD-10-CM

## 2016-02-27 DIAGNOSIS — Z23 Encounter for immunization: Secondary | ICD-10-CM

## 2016-02-27 DIAGNOSIS — H1131 Conjunctival hemorrhage, right eye: Secondary | ICD-10-CM | POA: Diagnosis not present

## 2016-02-27 DIAGNOSIS — F411 Generalized anxiety disorder: Secondary | ICD-10-CM | POA: Diagnosis not present

## 2016-02-27 NOTE — Progress Notes (Signed)
   Subjective:    Patient ID: Tracy PeltonHeather G Baldwin, female    DOB: 1971/02/02, 45 y.o.   MRN: [id number]  HPI Here to discuss possible side effects to Cymbalta. She has been on this for about 4 weeks and she does think it helped her anxiety. However she has started to have other things happen to her that are unusual for her. She has had pimples come up on her face, she has had swelling in the feet and ankles, she has felt more tired than usual, and then yesterday she awoke to see a blood in the right eye. No recent trauma. The eye does not hurt and her vision is fine. No other bleeding or bruising issues.    Review of Systems  Constitutional: Positive for fatigue.  Eyes: Positive for redness. Negative for photophobia, pain, discharge, itching and visual disturbance.  Respiratory: Negative.   Cardiovascular: Positive for leg swelling. Negative for chest pain and palpitations.  Gastrointestinal: Negative.   Neurological: Positive for headaches.  Hematological: Does not bruise/bleed easily.  Psychiatric/Behavioral: Negative for agitation, confusion, dysphoric mood and hallucinations. The patient is nervous/anxious.        Objective:   Physical Exam  Constitutional: She is oriented to person, place, and time. She appears well-developed and well-nourished. No distress.  HENT:  Head: Normocephalic and atraumatic.  Eyes: EOM are normal. Pupils are equal, round, and reactive to light.  Right eye has some subconjunctival bleeding on the nasal half, cornea is clear  Musculoskeletal: She exhibits no edema.  Neurological: She is alert and oriented to person, place, and time.          Assessment & Plan:  First I explained that she has a subconjunctival hemorrhage and that this will resolve on its own over the xt 7-10 days. I do not think this was related to the Cymbalta at all. However the fatigue and skin changes and ankle swelling probably are side effects of the Cymbalta, so we agreed to  stop taking this. At this point she has decided to deal with the anxiety on her own and to not take any medication for it. She will follow up prn.  Nelwyn SalisburyFRY,Unknown Flannigan A, MD

## 2016-02-27 NOTE — Progress Notes (Signed)
Pre visit review using our clinic review tool, if applicable. No additional management support is needed unless otherwise documented below in the visit note. 

## 2016-02-27 NOTE — Addendum Note (Signed)
Addended by: Aniceto BossNIMMONS, SYLVIA A on: 02/27/2016 11:25 AM   Modules accepted: Orders

## 2016-03-04 ENCOUNTER — Telehealth: Payer: Self-pay | Admitting: Family Medicine

## 2016-03-04 NOTE — Telephone Encounter (Signed)
Pt states she sprang her ankle 3 days after she saw Dr Clent RidgesFry last week. [facility] put her on 325 mg Asprin BID. Pt states she is having some nausea.. Would like to know what Dr Clent RidgesFry thinks.  Could it be from the Cymbalta pt has stopped taking?  Or from the Asprin?   Pt has called [facility] but has not heard back from them.

## 2016-03-05 NOTE — Telephone Encounter (Signed)
More than likely the nausea is from the aspirin. Try adding Zantac 150 mg bid to the aspirin (OTC)

## 2016-03-05 NOTE — Telephone Encounter (Signed)
I spoke with pt and went over below information. 

## 2016-05-15 ENCOUNTER — Other Ambulatory Visit: Payer: Self-pay | Admitting: Obstetrics and Gynecology

## 2016-05-15 DIAGNOSIS — Z1231 Encounter for screening mammogram for malignant neoplasm of breast: Secondary | ICD-10-CM

## 2016-06-21 HISTORY — PX: COLONOSCOPY: SHX174

## 2016-07-01 ENCOUNTER — Telehealth: Payer: Self-pay | Admitting: Family Medicine

## 2016-07-01 NOTE — Telephone Encounter (Signed)
Pt would like to see if you could send her some Tamiflu in due to the daughter being Dx with the influenza B  Pharm:  [facility] [address]

## 2016-07-01 NOTE — Telephone Encounter (Signed)
Call in Tamiflu 75 mg bid for 5 days  

## 2016-07-02 ENCOUNTER — Ambulatory Visit: Payer: Managed Care, Other (non HMO)

## 2016-07-02 MED ORDER — OSELTAMIVIR PHOSPHATE 75 MG PO CAPS: 75.0000 mg | ORAL_CAPSULE | Freq: Two times a day (BID) | ORAL | 0 refills | Status: DC

## 2016-07-02 NOTE — Telephone Encounter (Signed)
Rx sent 

## 2016-07-02 NOTE — Addendum Note (Signed)
Addended by: Marcell AngerSELF, SARAH E on: 07/02/2016 03:19 PM   Modules accepted: Orders

## 2016-07-17 ENCOUNTER — Ambulatory Visit
Admission: RE | Admit: 2016-07-17 | Discharge: 2016-07-17 | Disposition: A | Discharge status: Home / Self Care | Payer: Managed Care, Other (non HMO) | Source: Ambulatory Visit | Attending: Obstetrics and Gynecology | Admitting: Obstetrics and Gynecology

## 2016-07-17 DIAGNOSIS — Z1231 Encounter for screening mammogram for malignant neoplasm of breast: Secondary | ICD-10-CM

## 2017-04-11 ENCOUNTER — Telehealth: Payer: Self-pay | Admitting: Family Medicine

## 2017-04-11 ENCOUNTER — Ambulatory Visit: Payer: Managed Care, Other (non HMO) | Admitting: Family Medicine

## 2017-04-11 ENCOUNTER — Encounter: Payer: Self-pay | Admitting: Family Medicine

## 2017-04-11 VITALS — BP 112/74 | HR 80 | Temp 98.4°F | Wt 152.8 lb

## 2017-04-11 DIAGNOSIS — J018 Other acute sinusitis: Secondary | ICD-10-CM | POA: Diagnosis not present

## 2017-04-11 MED ORDER — AZITHROMYCIN 250 MG PO TABS: ORAL_TABLET | ORAL | 0 refills | Status: DC

## 2017-04-11 NOTE — Telephone Encounter (Signed)
Copied from CRM [id number]. Topic: Quick Communication - See Telephone Encounter >> Apr 11, 2017  2:32 PM Oneal GroutSebastian, Jennifer S wrote: CRM for notification. See Telephone encounter for: Has questions on directions for azithromycin (ZITHROMAX) 250 MG   04/11/17.

## 2017-04-11 NOTE — Progress Notes (Signed)
   Subjective:    Patient ID: Tracy PeltonHeather G Baldwin, female    DOB: 07-16-1970, 46 y.o.   MRN: [id number]  HPI Here for 2 weeks of sinus pressure, dizziness, PND and coughing up green sputum. No fever.     Review of Systems  Constitutional: Negative.   HENT: Positive for congestion, postnasal drip, sinus pressure and sinus pain. Negative for sore throat.   Eyes: Negative.   Respiratory: Positive for cough.        Objective:   Physical Exam  Constitutional: She appears well-developed and well-nourished.  HENT:  Right Ear: External ear normal.  Left Ear: External ear normal.  Nose: Nose normal.  Mouth/Throat: Oropharynx is clear and moist.  Eyes: Conjunctivae are normal.  Neck: No thyromegaly present.  Pulmonary/Chest: Effort normal and breath sounds normal. No respiratory distress. She has no wheezes. She has no rales.  Lymphadenopathy:    She has no cervical adenopathy.          Assessment & Plan:  Sinusitis, treat with a Zpack. Add Mucinex D.  Gershon CraneStephen Fry, MD

## 2017-04-14 NOTE — Telephone Encounter (Signed)
Attempted to call pt.; left voice message to return call re: questions on Azithromycin.

## 2017-04-21 ENCOUNTER — Encounter: Payer: Managed Care, Other (non HMO) | Admitting: Family Medicine

## 2017-04-30 ENCOUNTER — Ambulatory Visit (INDEPENDENT_AMBULATORY_CARE_PROVIDER_SITE_OTHER): Payer: Managed Care, Other (non HMO) | Admitting: Family Medicine

## 2017-04-30 ENCOUNTER — Encounter: Payer: Self-pay | Admitting: Family Medicine

## 2017-04-30 VITALS — BP 114/62 | HR 78 | Temp 98.7°F | Ht 62.5 in | Wt 153.0 lb

## 2017-04-30 DIAGNOSIS — Z9109 Other allergy status, other than to drugs and biological substances: Secondary | ICD-10-CM | POA: Diagnosis not present

## 2017-04-30 DIAGNOSIS — R7989 Other specified abnormal findings of blood chemistry: Secondary | ICD-10-CM | POA: Diagnosis not present

## 2017-04-30 DIAGNOSIS — Z0000 Encounter for general adult medical examination without abnormal findings: Secondary | ICD-10-CM

## 2017-04-30 LAB — POCT URINALYSIS DIPSTICK
BILIRUBIN UA: NEGATIVE
Blood, UA: NEGATIVE
Glucose, UA: NEGATIVE
KETONES UA: NEGATIVE
Leukocytes, UA: NEGATIVE
Nitrite, UA: NEGATIVE
PH UA: 7.5 (ref 5.0–8.0)
PROTEIN UA: NEGATIVE
Spec Grav, UA: 1.015 (ref 1.010–1.025)
UROBILINOGEN UA: 0.2 U/dL

## 2017-04-30 NOTE — Progress Notes (Signed)
   Subjective:    Patient ID: Tracy PeltonHeather G Baldwin, female    DOB: 11/24/1970, 46 y.o.   MRN: [id number]  HPI Here for a well exam. She feels well but has concerns about her thyroid function. Her weight has been stable but she feels more tired than usual . She recently saw her GYN who confirmed she is menopausal now and apparently her TSH was high. She has longstanding allergy problems with sneezing, itchy eyes, etc and she asks to be tested for this.    Review of Systems  Constitutional: Positive for fatigue. Negative for appetite change and unexpected weight change.  HENT: Positive for rhinorrhea and sneezing. Negative for congestion, ear pain, nosebleeds, postnasal drip, sinus pressure, sinus pain and sore throat.   Eyes: Negative.   Respiratory: Negative.   Cardiovascular: Negative.   Gastrointestinal: Negative.   Genitourinary: Negative for decreased urine volume, difficulty urinating, dyspareunia, dysuria, enuresis, flank pain, frequency, hematuria, pelvic pain and urgency.  Musculoskeletal: Negative.   Skin: Negative.   Neurological: Negative.   Psychiatric/Behavioral: Negative.        Objective:   Physical Exam  Constitutional: She is oriented to person, place, and time. She appears well-developed and well-nourished. No distress.  HENT:  Head: Normocephalic and atraumatic.  Right Ear: External ear normal.  Left Ear: External ear normal.  Nose: Nose normal.  Mouth/Throat: Oropharynx is clear and moist. No oropharyngeal exudate.  Eyes: Conjunctivae and EOM are normal. Pupils are equal, round, and reactive to light. No scleral icterus.  Neck: Normal range of motion. Neck supple. No JVD present. No thyromegaly present.  Cardiovascular: Normal rate, regular rhythm, normal heart sounds and intact distal pulses. Exam reveals no gallop and no friction rub.  No murmur heard. Pulmonary/Chest: Effort normal and breath sounds normal. No respiratory distress. She has no wheezes. She  has no rales. She exhibits no tenderness.  Abdominal: Soft. Bowel sounds are normal. She exhibits no distension and no mass. There is no tenderness. There is no rebound and no guarding.  Musculoskeletal: Normal range of motion. She exhibits no edema or tenderness.  Lymphadenopathy:    She has no cervical adenopathy.  Neurological: She is alert and oriented to person, place, and time. She has normal reflexes. No cranial nerve deficit. She exhibits normal muscle tone. Coordination normal.  Skin: Skin is warm and dry. No rash noted. No erythema.  Psychiatric: She has a normal mood and affect. Her behavior is normal. Judgment and thought content normal.          Assessment & Plan:  Well exam. We discussed diet and exercise. Get fasting labs soon including a thyroid panel. Refer to Allergy. Gershon CraneStephen Savian Mazon, MD

## 2017-05-02 ENCOUNTER — Other Ambulatory Visit (INDEPENDENT_AMBULATORY_CARE_PROVIDER_SITE_OTHER): Payer: Managed Care, Other (non HMO)

## 2017-05-02 DIAGNOSIS — R7989 Other specified abnormal findings of blood chemistry: Secondary | ICD-10-CM

## 2017-05-02 DIAGNOSIS — Z0000 Encounter for general adult medical examination without abnormal findings: Secondary | ICD-10-CM

## 2017-05-02 LAB — CBC WITH DIFFERENTIAL/PLATELET
Basophils Absolute: 0 10*3/uL (ref 0.0–0.1)
Basophils Relative: 0.5 % (ref 0.0–3.0)
EOS ABS: 0.1 10*3/uL (ref 0.0–0.7)
EOS PCT: 2.2 % (ref 0.0–5.0)
HEMATOCRIT: 39 % (ref 36.0–46.0)
HEMOGLOBIN: 13 g/dL (ref 12.0–15.0)
Lymphocytes Relative: 32.7 % (ref 12.0–46.0)
Lymphs Abs: 1.1 10*3/uL (ref 0.7–4.0)
MCHC: 33.5 g/dL (ref 30.0–36.0)
MCV: 90.7 fl (ref 78.0–100.0)
MONO ABS: 0.4 10*3/uL (ref 0.1–1.0)
Monocytes Relative: 11 % (ref 3.0–12.0)
NEUTROS ABS: 1.8 10*3/uL (ref 1.4–7.7)
Neutrophils Relative %: 53.6 % (ref 43.0–77.0)
PLATELETS: 218 10*3/uL (ref 150.0–400.0)
RBC: 4.3 Mil/uL (ref 3.87–5.11)
RDW: 13.6 % (ref 11.5–15.5)
WBC: 3.4 10*3/uL — AB (ref 4.0–10.5)

## 2017-05-02 LAB — LIPID PANEL
CHOLESTEROL: 155 mg/dL (ref 0–200)
HDL: 69.2 mg/dL (ref >39.00)
LDL Cholesterol: 75 mg/dL (ref 0–99)
NonHDL: 85.48
TRIGLYCERIDES: 50 mg/dL (ref 0.0–149.0)
Total CHOL/HDL Ratio: 2
VLDL: 10 mg/dL (ref 0.0–40.0)

## 2017-05-02 LAB — HEPATIC FUNCTION PANEL
ALBUMIN: 4.3 g/dL (ref 3.5–5.2)
ALT: 16 U/L (ref 0–35)
AST: 16 U/L (ref 0–37)
Alkaline Phosphatase: 64 U/L (ref 39–117)
BILIRUBIN TOTAL: 0.4 mg/dL (ref 0.2–1.2)
Bilirubin, Direct: 0.1 mg/dL (ref 0.0–0.3)
Total Protein: 6.9 g/dL (ref 6.0–8.3)

## 2017-05-02 LAB — FERRITIN: Ferritin: 39.7 ng/mL (ref 10.0–291.0)

## 2017-05-02 LAB — VITAMIN B12: VITAMIN B 12: 692 pg/mL (ref 211–911)

## 2017-05-02 LAB — BASIC METABOLIC PANEL
BUN: 16 mg/dL (ref 6–23)
CALCIUM: 9.1 mg/dL (ref 8.4–10.5)
CO2: 29 mEq/L (ref 19–32)
Chloride: 103 mEq/L (ref 96–112)
Creatinine, Ser: 0.59 mg/dL (ref 0.40–1.20)
GFR: 116.54 mL/min (ref >60.00)
Glucose, Bld: 96 mg/dL (ref 70–99)
POTASSIUM: 4.5 meq/L (ref 3.5–5.1)
SODIUM: 138 meq/L (ref 135–145)

## 2017-05-02 LAB — T3, FREE: T3, Free: 4 pg/mL (ref 2.3–4.2)

## 2017-05-02 LAB — T4, FREE: FREE T4: 0.93 ng/dL (ref 0.60–1.60)

## 2017-05-02 LAB — TSH: TSH: 0.92 u[IU]/mL (ref 0.35–4.50)

## 2017-05-06 LAB — THYROID PEROXIDASE ANTIBODY: Thyroperoxidase Ab SerPl-aCnc: 1 IU/mL (ref <9)

## 2017-05-06 LAB — THYROID STIMULATING IMMUNOGLOBULIN: TSI: 146 %{baseline} — AB (ref <140)

## 2017-06-13 ENCOUNTER — Other Ambulatory Visit: Payer: Self-pay | Admitting: Obstetrics and Gynecology

## 2017-06-13 DIAGNOSIS — Z1231 Encounter for screening mammogram for malignant neoplasm of breast: Secondary | ICD-10-CM

## 2017-07-21 ENCOUNTER — Ambulatory Visit
Admission: RE | Admit: 2017-07-21 | Discharge: 2017-07-21 | Disposition: A | Discharge status: Home / Self Care | Payer: 59 | Source: Ambulatory Visit | Attending: Obstetrics and Gynecology | Admitting: Obstetrics and Gynecology

## 2017-07-21 DIAGNOSIS — Z1231 Encounter for screening mammogram for malignant neoplasm of breast: Secondary | ICD-10-CM

## 2017-07-22 ENCOUNTER — Other Ambulatory Visit: Payer: Self-pay | Admitting: Obstetrics and Gynecology

## 2017-07-22 DIAGNOSIS — R928 Other abnormal and inconclusive findings on diagnostic imaging of breast: Secondary | ICD-10-CM

## 2017-07-25 ENCOUNTER — Ambulatory Visit
Admission: RE | Admit: 2017-07-25 | Discharge: 2017-07-25 | Disposition: A | Discharge status: Home / Self Care | Payer: 59 | Source: Ambulatory Visit | Attending: Obstetrics and Gynecology | Admitting: Obstetrics and Gynecology

## 2017-07-25 ENCOUNTER — Ambulatory Visit: Payer: 59

## 2017-07-25 DIAGNOSIS — R928 Other abnormal and inconclusive findings on diagnostic imaging of breast: Secondary | ICD-10-CM

## 2018-04-29 ENCOUNTER — Ambulatory Visit: Payer: Self-pay

## 2018-04-29 NOTE — Telephone Encounter (Signed)
Patient called in with c/o "fingers swelling." She says "this has been ongoing now for a few months, but is starting to become an every evening event. I am not able to put my rings on and my fingers feel tight, no pain, no redness." I asked about other symptoms, she denies. According to protocol, see PCP within 2 weeks, appointment scheduled for Friday, 05/01/18 at 1300 with Dr. Clent RidgesFry, care advice given, patient verbalized understanding.   Reason for Disposition . [1] Swelling (puffiness of hand or hands) [2] is a chronic symptom (recurrent or ongoing AND present > 4 weeks)  Answer Assessment - Initial Assessment Questions 1. ONSET: "When did the swelling start?" (e.g., minutes, hours, days)     Ongoing mainly in the evenings 2. LOCATION: "What part of the hand is swollen?"  "Are both hands swollen or just one hand?"     Fingers 3. SEVERITY: "How bad is the swelling?" (e.g., localized; mild, moderate, severe)   - BALL OR LUMP: small ball or lump   - LOCALIZED: puffy or swollen area or patch of skin   - JOINT SWELLING: swelling of a joint   - MILD: puffiness or mild swelling of fingers or hand   - MODERATE: fingers and hand are swollen   - SEVERE: swelling of entire hand and up into forearm     Mild 4. REDNESS: "Does the swelling look red or infected?"     No 5. PAIN: "Is the swelling painful to touch?" If so, ask: "How painful is it?"   (Scale 1-10; mild, moderate or severe)     No 6. FEVER: "Do you have a fever?" If so, ask: "What is it, how was it measured, and when did it start?"      No 7. CAUSE: "What do you think is causing the hand swelling?" (e.g., heat, insect bite, pregnancy, recent injury)     I don't know 8. MEDICAL HISTORY: "Do you have a history of heart failure, kidney disease, liver failure, or cancer?"     No 9. RECURRENT SYMPTOM: "Have you had hand swelling before?" If so, ask: "When was the last time?" "What happened that time?"     This is new in the past few months  every night 10. OTHER SYMPTOMS: "Do you have any other symptoms?" (e.g., blurred vision, difficulty breathing, headache)       No 11. PREGNANCY: "Is there any chance you are pregnant?" "When was your last menstrual period?"       No  Protocols used: HAND [facility]

## 2018-04-29 NOTE — Telephone Encounter (Signed)
Call place to pt. Busy signal.

## 2018-05-01 ENCOUNTER — Encounter: Payer: Self-pay | Admitting: Family Medicine

## 2018-05-01 ENCOUNTER — Ambulatory Visit: Payer: 59 | Admitting: Family Medicine

## 2018-05-01 VITALS — BP 120/76 | HR 74 | Temp 97.6°F | Wt 147.4 lb

## 2018-05-01 DIAGNOSIS — M7989 Other specified soft tissue disorders: Secondary | ICD-10-CM

## 2018-05-01 DIAGNOSIS — R5383 Other fatigue: Secondary | ICD-10-CM | POA: Diagnosis not present

## 2018-05-01 NOTE — Progress Notes (Signed)
   Subjective:    Patient ID: Tracy Baldwin, female    DOB: 05-Aug-1970, 47 y.o.   MRN: [id number]  HPI Here for several issues. First she reminds me that one year ago her chiropractor told her she had Hashimoto's thyroiditis and we did a lab workup to check this. We ruled it out at that time. Now she is seeing a different chiropractor, and he has suggested she has this as well. She describes feeling tired all the time. She has lost 8 lbs in the past 3 months, but she has been trying to lose by dieting. She asks for a referral to a specialist to address this once and for all. Also for the past 2 months she has had intermittent swelling and stiffness in both hands. She denies any actual pain. This is worse in the afternoons and evenings, but it goes away in the mornings. No feet swelling or SOB. She has a family hx of some autoimmune conditions like rheumatoid arthritis and vitiligo.   Review of Systems  Constitutional: Positive for fatigue.  Respiratory: Negative.   Cardiovascular: Negative.   Gastrointestinal: Negative.   Genitourinary: Negative.   Musculoskeletal: Positive for arthralgias and joint swelling.  Neurological: Negative.        Objective:   Physical Exam Constitutional:      Appearance: Normal appearance. She is not ill-appearing.  Neck:     Comments: Thyroid is normal  Cardiovascular:     Rate and Rhythm: Normal rate and regular rhythm.     Pulses: Normal pulses.     Heart sounds: Normal heart sounds.  Pulmonary:     Effort: Pulmonary effort is normal.     Breath sounds: Normal breath sounds.  Musculoskeletal:        General: No swelling.     Comments: Her hands are normal on exam, no swelling or tenderness   Lymphadenopathy:     Cervical: No cervical adenopathy.  Neurological:     Mental Status: She is alert.           Assessment & Plan:  She has fatigue and swelling in the hands, and at her request we will refer her to see Endocrinology and  Rheumatology to evaluate.  Gershon CraneStephen Stepanie Graver, MD

## 2018-05-26 ENCOUNTER — Telehealth: Payer: Self-pay | Admitting: Family Medicine

## 2018-05-26 DIAGNOSIS — E069 Thyroiditis, unspecified: Secondary | ICD-10-CM

## 2018-05-26 NOTE — Telephone Encounter (Signed)
Dr. Fry please advise. Thanks  

## 2018-05-26 NOTE — Telephone Encounter (Signed)
Copied from CRM [id number]. Topic: General - Other >> May 26, 2018 11:18 AM Tamela Oddi wrote: Reason for CRM: Patient called to inform the doctor that the Endocrinologist that she was referred to is not available until June 2020.  Patient would like for the doctor to refer her to another doctor (Dr. Tamsen Meek) to see if he is available sooner.  Please advise and call patient back at [phone]

## 2018-05-27 NOTE — Telephone Encounter (Signed)
Done

## 2018-05-27 NOTE — Telephone Encounter (Signed)
I have called the pt and Tracy Baldwin to make her aware of the other referral to see if we can get her in sooner.

## 2018-06-09 ENCOUNTER — Encounter: Payer: Self-pay | Admitting: Internal Medicine

## 2018-06-25 ENCOUNTER — Ambulatory Visit (INDEPENDENT_AMBULATORY_CARE_PROVIDER_SITE_OTHER): Payer: 59 | Admitting: Internal Medicine

## 2018-06-25 ENCOUNTER — Encounter: Payer: Self-pay | Admitting: Internal Medicine

## 2018-06-25 VITALS — BP 108/70 | HR 79 | Ht 62.5 in | Wt 148.0 lb

## 2018-06-25 DIAGNOSIS — E063 Autoimmune thyroiditis: Secondary | ICD-10-CM | POA: Diagnosis not present

## 2018-06-25 NOTE — Progress Notes (Signed)
Patient ID: Tracy PeltonHeather G Kalas, female   DOB: 1970/12/20, 48 y.o.   MRN: [id number]    HPI  Tracy Baldwin is a 48 y.o.-year-old female, referred by her PCP, Dr. Clent RidgesFry, for management of euthyroid Hashimoto's thyroiditis.  Pt. has been dx with Hashimoto's thyroiditis by her chiropractor (who is also a functional medicine provider) ~1 year ago.  She does not remember details of this investigation but she was told that she may have this condition based on lab results.  I do not have these available.  The diagnosis was confirmed by another chiropractor, but no results are available.  She was started on - Thyraxis -PT 3 tabs a day (which is 3 times the recommended dose on the package): Bovine thyroid extract, Pituitary porcine extract, etc Her hot flashes resolved after starting this medication.  I reviewed pt's thyroid tests: Lab Results  Component Value Date   TSH 0.92 05/02/2017   TSH 1.15 01/12/2014   TSH 1.05 10/01/2012   TSH 1.09 12/28/2009   TSH 1.07 11/23/2008   FREET4 0.93 05/02/2017    Component     Latest Ref Rng & Units 05/02/2017  TSI     <140 % baseline 146 (H)  Thyroperoxidase Ab SerPl-aCnc     <9 IU/mL 1   Pt describes: - fatigue - hand stiffness and swelling, but no pain - cold intolerance - weight gain - low libido - Easy bruising   She denies: - depression, but does have anxiety - constipation - dry skin - hair loss  Pt denies feeling nodules in neck, hoarseness, dysphagia/odynophagia, SOB with lying down.  She has + FH of thyroid disorders in: P aunt - thyroidectomy. No FH of thyroid cancer. + FH of autoimmune ds - vitiligo in mother. No h/o radiation tx to head or neck. No recent use of iodine supplements.  Pt. also has a history of infertility, endometriosis, and is now perimenopausal.  ROS: + Also see HPI Constitutional: + See HPI, + excessive urination, + occasional dysuria-was on antibiotics in December for UTI) Eyes: no blurry vision,  no xerophthalmia ENT: + Sore throat, + see HPI Cardiovascular: no CP/SOB/palpitations/leg swelling Respiratory: no cough/SOB Gastrointestinal: no N/V/D/C Musculoskeletal: + Both muscle/joint aches Skin: no rashes Neurological: no tremors/numbness/tingling/dizziness Psychiatric: no depression/anxiety  Past Medical History:  Diagnosis Date  . Allergy   . Headache(784.0)   . Heart murmur    as a child/resolved   Past Surgical History:  Procedure Laterality Date  . COLONOSCOPY  06/21/2016   per Dr. Evette CristalGanem, benign polyp, repeat in 5 yrs    Social History   Socioeconomic History  . Marital status: Married    Spouse name: Not on file  . Number of children: 152: 678 and 48 years old in 06/2018  . Years of education: Not on file  . Highest education level: Not on file  Occupational History  .  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 Financial resource strain: Not on file  . Food insecurity:    Worry: Not on file    Inability: Not on file  . Transportation needs:    Medical: Not on file    Non-medical: Not on file  Tobacco Use  . Smoking status: Never Smoker  . Smokeless tobacco: Never Used  Substance and Sexual Activity  . Alcohol use: Yes    Alcohol/week: 0.0 standard drinks    Comment: occ-socially, 1-3 drinks  . Drug use: No   Current Outpatient Medications on File Prior  to Visit  Medication Sig Dispense Refill  . NON FORMULARY     . NON FORMULARY     . NON FORMULARY     . NON FORMULARY     . NON FORMULARY seratone active to help with anxiety     No current facility-administered medications on file prior to visit.    Allergies  Allergen Reactions  . Cymbalta [Duloxetine Hcl] Swelling    When coming off this medication had bad side effects dizzy and nauseated for 2-3 weeks vertigo like symptoms    Family History  Problem Relation Age of Onset  . Arthritis Unknown        fhx  . Coronary artery disease Unknown        fhx  . Cancer Unknown         colon, lung, ovarian/fhx  . Diabetes Unknown        fhx  . Hypertension Unknown        fhx  . Stroke Unknown        fhx  . Colon polyps Father   . Breast cancer Neg Hx   Also: Diabetes in paternal grandmother, HTN in father, heart disease in father, breast cancer in paternal aunt  PE: BP 108/70   Pulse 79   Ht 5' 2.5" (1.588 m)   Wt 148 lb (67.1 kg)   SpO2 97%   BMI 26.64 kg/m  Wt Readings from Last 3 Encounters:  06/25/18 148 lb (67.1 kg)  05/01/18 147 lb 7 oz (66.9 kg)  04/30/17 153 lb (69.4 kg)   Constitutional: normal weight, in NAD Eyes: PERRLA, EOMI, no exophthalmos ENT: moist mucous membranes, + slight symmetric thyromegaly, no cervical lymphadenopathy Cardiovascular: RRR, No MRG Respiratory: CTA B Gastrointestinal: abdomen soft, NT, ND, BS+ Musculoskeletal: no deformities, strength intact in all 4 Skin: moist, warm, no rashes Neurological: no tremor with outstretched hands, DTR normal in all 4  ASSESSMENT: 1. Hashimoto thyroiditis  PLAN: 1. Hashimoto thyroiditis - I reviewed the results of her TPO and TSI antibodies.  The TSI antibodies are slightly elevated, which can happen in the setting of thyroiditis, and is mostly nonspecific.  The TPO antibodies are not elevated.  Also, reviewing her TFTs dating back to 2010, they have been perfect.  However, the latest results that I have are from 04/2017. - Patient describes that she has been diagnosed with Hashimoto's thyroiditis approximately a year ago by her chiropractor and functional medicine provider.  Unfortunately, the results of this investigation are not available.  This diagnosis was confirmed by another chiropractor later. - Unfortunately, she was started on a thyroid and pituitary extract to control her Hashimoto's thyroiditis.  We discussed about the negative consequences of taking such a product which is not regulated.  Moreover, she is taking 3 times the recommended dose.  I explained that taking these  hormones have unpredictable effect on her pituitary and thyroid, mostly suppressing her own thyroid hormone production.  I strongly advised her against using such a hormone especially since her TFTs, per my records, were excellent.  We discussed about the way that she should come off the supplement slowly to avoid problem with hypothyroidism or adrenal insufficiency.  I gave her regimen detailing how to taper the medication to off. -We will plan to have her back for labs 5 weeks after she finishes the tablets: Orders Placed This Encounter  Procedures  . TSH  . T4, free  . T3, free  . Thyroid peroxidase antibody  .  Thyroglobulin antibody  - we had a long discussion about  Hashimoto thyroiditis diagnosis in case she does have this. I explained that this is an autoimmune disorder, in which she develops antibodies against her own thyroid. The antibodies bind to the thyroid tissue and cause inflammation, and, eventually, destruction of the gland and hypothyroidism. We don't know how long this process can be, it can last from months to years.  - We discussed about treatment for Hashimoto thyroiditis, which is actually limited to thyroid hormones in case her TFTs are abnormal. Supplements like selenium has been tried with various results, some showing improvement in the TPO antibodies. However, there are no randomized controlled trials of this are consistent results between trials. We also discussed about ways to improve her immune system (relaxation, diet, exercise, sleep) to reduce the Ab titer and, subsequently, the thyroid inflammation. - I also explained that thyroid enlargement especially at the beginning of her Hashimoto thyroiditis course is not uncommon, and it has a waxing and waning character.  - Pt does not have a thyroid cancer family history or a personal history of RxTx to head/neck, so is not at high risk for [facility].  Also, no neck compression symptoms and no masses palpated in neck.  He does  have a slightly enlarged thyroid, but this is symmetric. I do not feel that the thyroid ultrasound is necessary. - I advised pt to join my chart and I will send her the results through there   Carlus Pavlovristina Kaysi Ourada, MD PhD [place] [facility]

## 2018-06-25 NOTE — Patient Instructions (Addendum)
Please taper down Thyraxis PT: - for the next 10 days, take 2 tablets - for the next 10 days, take 1 tablet - then stop  After you finish, come back for labs in 5 weeks.  Please come back for a follow-up appointment in 4 months.

## 2018-07-08 ENCOUNTER — Ambulatory Visit: Payer: 59 | Admitting: Family Medicine

## 2018-07-08 ENCOUNTER — Encounter: Payer: Self-pay | Admitting: Family Medicine

## 2018-07-08 VITALS — BP 110/70 | HR 80 | Temp 98.3°F | Ht 63.0 in | Wt 149.4 lb

## 2018-07-08 DIAGNOSIS — S39011A Strain of muscle, fascia and tendon of abdomen, initial encounter: Secondary | ICD-10-CM

## 2018-07-08 NOTE — Progress Notes (Signed)
   Subjective:    Patient ID: Tracy Baldwin, female    DOB: [birth year], 48 y.o.   MRN: [id number]  HPI Here for 3 days of a mild but sharp pain in the right side just below the ribs. No nausea or fever, no UTI symptoms. BMs are normal. It hurts to move certain ways. No recent trauma but she remembers having to lift a heavy bag of luggage into the trunk of her car the day before this started.   Review of Systems  Constitutional: Negative.   Respiratory: Negative.   Cardiovascular: Negative.   Gastrointestinal: Positive for abdominal pain. Negative for abdominal distention, anal bleeding, blood in stool, constipation, diarrhea, nausea, rectal pain and vomiting.  Genitourinary: Negative.        Objective:   Physical Exam Constitutional:      Appearance: Normal appearance. She is well-developed. She is not ill-appearing.  Cardiovascular:     Rate and Rhythm: Normal rate and regular rhythm.     Pulses: Normal pulses.     Heart sounds: Normal heart sounds.  Pulmonary:     Effort: Pulmonary effort is normal.     Breath sounds: Normal breath sounds.  Abdominal:     General: Abdomen is flat. Bowel sounds are normal. There is no distension.     Palpations: Abdomen is soft. There is no mass.     Tenderness: There is no right CVA tenderness, left CVA tenderness, guarding or rebound.     Hernia: No hernia is present.     Comments: Mildly tender in the LUQ just below the rib margin   Neurological:     Mental Status: She is alert.           Assessment & Plan:  Abdominal muscle strain. She can rest and take Ibuprofen as needed. This should heal in a week or two.  Gershon Crane, MD

## 2018-08-28 ENCOUNTER — Other Ambulatory Visit: Payer: Self-pay

## 2018-08-28 ENCOUNTER — Telehealth: Payer: Self-pay | Admitting: *Deleted

## 2018-08-28 ENCOUNTER — Ambulatory Visit (INDEPENDENT_AMBULATORY_CARE_PROVIDER_SITE_OTHER): Payer: 59 | Admitting: Family Medicine

## 2018-08-28 DIAGNOSIS — F411 Generalized anxiety disorder: Secondary | ICD-10-CM | POA: Diagnosis not present

## 2018-08-28 MED ORDER — FLUOXETINE HCL 10 MG PO CAPS: 10.0000 mg | ORAL_CAPSULE | Freq: Every day | ORAL | 3 refills | Status: DC

## 2018-08-28 NOTE — Telephone Encounter (Signed)
Called and spoke with pt and she is aware of appt today with Dr. Clent Ridges at [phone]

## 2018-08-28 NOTE — Telephone Encounter (Signed)
Copied from CRM [id number]. Topic: General - Inquiry >> Aug 28, 2018 10:20 AM Tracy Baldwin wrote: Reason for CRM: Pt stated she would like to be put back on anxiety medication. She would like to know what she needs to do to have this prescribed. Please advise.

## 2018-08-31 ENCOUNTER — Other Ambulatory Visit: Payer: 59

## 2018-08-31 ENCOUNTER — Encounter: Payer: Self-pay | Admitting: Family Medicine

## 2018-08-31 ENCOUNTER — Other Ambulatory Visit: Payer: Self-pay | Admitting: Obstetrics and Gynecology

## 2018-08-31 DIAGNOSIS — Z1231 Encounter for screening mammogram for malignant neoplasm of breast: Secondary | ICD-10-CM

## 2018-08-31 NOTE — Progress Notes (Signed)
Subjective:    Patient ID: Tracy Baldwin, female    DOB: 1971/01/05, 48 y.o.   MRN: [id number]  HPI Virtual Visit via Video Note  I connected with the patient on 08/31/18 at  3:30 PM EDT by a video enabled telemedicine application and verified that I am speaking with the correct person using two identifiers.  Location patient: home Location provider:work or home office Persons participating in the virtual visit: patient, provider  I discussed the limitations of evaluation and management by telemedicine and the availability of in person appointments. The patient expressed understanding and agreed to proceed.   HPI: She is asking to try another medication for anxiety. Over the past few weeks she has felt more anxious and nervous, and she has trouble sleeping. She had tried Cymbalta and Lexapro in the past with varying levels of success, but they had some side effects like sedation. She denies much in the way of depression.    ROS: See pertinent positives and negatives per HPI.  Past Medical History:  Diagnosis Date  . Allergy   . Headache(784.0)   . Heart murmur    as a child/resolved    Past Surgical History:  Procedure Laterality Date  . COLONOSCOPY  06/21/2016   per Dr. Evette Cristal, benign polyp, repeat in 5 yrs     Family History  Problem Relation Age of Onset  . Arthritis Unknown        fhx  . Coronary artery disease Unknown        fhx  . Cancer Unknown        colon, lung, ovarian/fhx  . Diabetes Unknown        fhx  . Hypertension Unknown        fhx  . Stroke Unknown        fhx  . Colon polyps Father   . Breast cancer Neg Hx      Current Outpatient Medications:  .  NON FORMULARY, , Disp: , Rfl:  .  NON FORMULARY, , Disp: , Rfl:  .  NON FORMULARY, , Disp: , Rfl:  .  NON FORMULARY, , Disp: , Rfl:  .  NON FORMULARY, seratone active to help with anxiety, Disp: , Rfl:  .  FLUoxetine (PROZAC) 10 MG capsule, Take 1 capsule (10 mg total) by mouth daily.,  Disp: 30 capsule, Rfl: 3  EXAM:  VITALS per patient if applicable:  GENERAL: alert, oriented, appears well and in no acute distress  HEENT: atraumatic, conjunttiva clear, no obvious abnormalities on inspection of external nose and ears  NECK: normal movements of the head and neck  LUNGS: on inspection no signs of respiratory distress, breathing rate appears normal, no obvious gross SOB, gasping or wheezing  CV: no obvious cyanosis  MS: moves all visible extremities without noticeable abnormality  PSYCH/NEURO: pleasant and cooperative, no obvious depression or anxiety, speech and thought processing grossly intact  ASSESSMENT AND PLAN: Anxiety, she will try Prozac 10 mg daily. She knows that we may need to titrate up on the dose after a few weeks.  Gershon Crane, MD  Discussed the following assessment and plan:  No diagnosis found.     I discussed the assessment and treatment plan with the patient. The patient was provided an opportunity to ask questions and all were answered. The patient agreed with the plan and demonstrated an understanding of the instructions.   The patient was advised to call back or seek an in-person evaluation if the symptoms worsen  or if the condition fails to improve as anticipated.     Review of Systems     Objective:   Physical Exam        Assessment & Plan:

## 2018-09-02 ENCOUNTER — Other Ambulatory Visit: Payer: 59

## 2018-10-21 ENCOUNTER — Other Ambulatory Visit: Payer: 59

## 2018-10-22 ENCOUNTER — Other Ambulatory Visit (INDEPENDENT_AMBULATORY_CARE_PROVIDER_SITE_OTHER): Payer: 59

## 2018-10-22 ENCOUNTER — Other Ambulatory Visit: Payer: Self-pay

## 2018-10-22 DIAGNOSIS — E063 Autoimmune thyroiditis: Secondary | ICD-10-CM

## 2018-10-22 LAB — T4, FREE: Free T4: 0.88 ng/dL (ref 0.60–1.60)

## 2018-10-22 LAB — TSH: TSH: 1.56 u[IU]/mL (ref 0.35–4.50)

## 2018-10-22 LAB — T3, FREE: T3, Free: 3.2 pg/mL (ref 2.3–4.2)

## 2018-10-23 LAB — THYROGLOBULIN ANTIBODY: Thyroglobulin Ab: 4 IU/mL — ABNORMAL HIGH (ref <=1)

## 2018-10-23 LAB — THYROID PEROXIDASE ANTIBODY: Thyroperoxidase Ab SerPl-aCnc: 1 IU/mL (ref <9)

## 2018-10-26 ENCOUNTER — Other Ambulatory Visit: Payer: Self-pay

## 2018-10-27 ENCOUNTER — Encounter: Payer: Self-pay | Admitting: Internal Medicine

## 2018-10-27 ENCOUNTER — Ambulatory Visit: Payer: 59 | Admitting: Internal Medicine

## 2018-10-27 VITALS — BP 108/60 | HR 72 | Ht 63.0 in | Wt 156.0 lb

## 2018-10-27 DIAGNOSIS — E063 Autoimmune thyroiditis: Secondary | ICD-10-CM | POA: Diagnosis not present

## 2018-10-27 NOTE — Patient Instructions (Signed)
Please try Selenium 200 mcg daily.  Please come back for a follow-up appointment in 1 year.

## 2018-10-27 NOTE — Progress Notes (Signed)
Patient ID: Tracy PeltonHeather G Baldwin, female   DOB: 07-23-1970, 48 y.o.   MRN: [id number]    HPI  Tracy PeltonHeather G Baldwin is a 48 y.o.-year-old female, initially referred by her PCP, Dr. Clent Baldwin, returning for follow-up for euthyroid Hashimoto's thyroiditis.  Last visit 6 months ago.  Reviewed and addended history: Pt. has been dx with Hashimoto's thyroiditis by her chiropractor (who is also a functional medicine provider) ~1 year ago.  She does not remember details of this investigation but she was told that she may have this condition based on lab results.  I do not have these available.  The diagnosis was confirmed by another chiropractor, but no results are available.  She was initially started on: - Thyraxis -PT 3 tabs a day (which is 3 times the recommended dose on the package): Bovine thyroid extract, Pituitary porcine extract, etc Her hot flashes initially resolved after starting the medication.  However, we discussed that her thyroid tests were normal before she started the medication (and at this visit she brings the records from then: TSH normal, at 0.84 on 08/29/2015) and it was unclear why she was actually started on the thyroid hormone supplement.    At last visit, I advised her to slowly decrease the doses, and come back for labs approximately 1 month after she stops the medication.  She presented for labs 5 days ago, approximately 2 to 3 months after she stopped the Thyraxis PT and, indeed, TFTs were normal now off the medication.  At this visit, she complains of: - weight gain after starting Fluoxetine - joint aches - sleep is worse after starting Fluoxetine - less anxiety  after starting Fluoxetine - hot flushes - still some, but not bothersome  Reviewed patient's TFTs: Lab Results  Component Value Date   TSH 1.56 10/22/2018   TSH 0.92 05/02/2017   TSH 1.15 01/12/2014   TSH 1.05 10/01/2012   TSH 1.09 12/28/2009   TSH 1.07 11/23/2008   FREET4 0.88 10/22/2018   FREET4 0.93  05/02/2017  Previous TSH 0.84  Thyroid antibodies were elevated: Component     Latest Ref Rng & Units 10/22/2018  Thyroglobulin Ab     < or = 1 IU/mL 4 (H)  Thyroperoxidase Ab SerPl-aCnc     <9 IU/mL 1   Component     Latest Ref Rng & Units 05/02/2017  TSI     <140 % baseline 146 (H)  Thyroperoxidase Ab SerPl-aCnc     <9 IU/mL 1  08/29/2015: TPO antibody 13 (0-34), ATA antibodies 4.4 (0-0.9)  Pt denies: - feeling nodules in neck - hoarseness - dysphagia - choking - SOB with lying down  She has + FH of thyroid disorders in: P aunt - thyroidectomy. No FH of thyroid cancer. + FH of autoimmune ds - vitiligo in mother. No herbal supplements. No Biotin use. No recent steroids use.   Pt. also has a history of infertility, endometriosis, and is now perimenopausal.  ROS: Constitutional: + weight gain/no weight loss, + fatigue, no subjective hyperthermia, no subjective hypothermia Eyes: no blurry vision, no xerophthalmia ENT: no sore throat, + see HPI  Cardiovascular: no CP/no SOB/no palpitations/no leg swelling Respiratory: no cough/no SOB/no wheezing Gastrointestinal: no N/no V/no D/no C/no acid reflux Musculoskeletal: no muscle aches/+ joint aches Skin: no rashes, no hair loss Neurological: no tremors/no numbness/no tingling/no dizziness  I reviewed pt's medications, allergies, PMH, social hx, family hx, and changes were documented in the history of present illness. Otherwise, unchanged from my initial  visit note.  Past Medical History:  Diagnosis Date  . Allergy   . Headache(784.0)   . Heart murmur    as a child/resolved   Past Surgical History:  Procedure Laterality Date  . COLONOSCOPY  06/21/2016   per Dr. Evette CristalGanem, benign polyp, repeat in 5 yrs    Social History   Socioeconomic History  . Marital status: Married    Spouse name: Not on file  . Number of children: 212: 248 and 48 years old in 06/2018  . Years of education: Not on file  . Highest education level: Not on  file  Occupational History  .  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 Financial resource strain: Not on file  . Food insecurity:    Worry: Not on file    Inability: Not on file  . Transportation needs:    Medical: Not on file    Non-medical: Not on file  Tobacco Use  . Smoking status: Never Smoker  . Smokeless tobacco: Never Used  Substance and Sexual Activity  . Alcohol use: Yes    Alcohol/week: 0.0 standard drinks    Comment: occ-socially, 1-3 drinks  . Drug use: No   Current Outpatient Medications on File Prior to Visit  Medication Sig Dispense Refill  . FLUoxetine (PROZAC) 10 MG capsule Take 1 capsule (10 mg total) by mouth daily. 30 capsule 3  . NON FORMULARY     . NON FORMULARY     . NON FORMULARY     . NON FORMULARY     . NON FORMULARY seratone active to help with anxiety     No current facility-administered medications on file prior to visit.    Allergies  Allergen Reactions  . Cymbalta [Duloxetine Hcl] Swelling    When coming off this medication had bad side effects dizzy and nauseated for 2-3 weeks vertigo like symptoms    Family History  Problem Relation Age of Onset  . Arthritis Unknown        fhx  . Coronary artery disease Unknown        fhx  . Cancer Unknown        colon, lung, ovarian/fhx  . Diabetes Unknown        fhx  . Hypertension Unknown        fhx  . Stroke Unknown        fhx  . Colon polyps Father   . Breast cancer Neg Hx   Also: Diabetes in paternal grandmother, HTN in father, heart disease in father, breast cancer in paternal aunt  PE: BP 108/60   Pulse 72   Ht 5\' 3"  (1.6 m)   Wt 156 lb (70.8 kg)   SpO2 98%   BMI 27.63 kg/m  Wt Readings from Last 3 Encounters:  10/27/18 156 lb (70.8 kg)  07/08/18 149 lb 6 oz (67.8 kg)  06/25/18 148 lb (67.1 kg)   Constitutional: Normal weight, in NAD Eyes: PERRLA, EOMI, no exophthalmos ENT: moist mucous membranes, + slight symmetric thyromegaly, no cervical  lymphadenopathy Cardiovascular: RRR, No MRG Respiratory: CTA B Gastrointestinal: abdomen soft, NT, ND, BS+ Musculoskeletal: no deformities, strength intact in all 4 Skin: moist, warm, no rashes Neurological: no tremor with outstretched hands, DTR normal in all 4  ASSESSMENT: 1. Hashimoto thyroiditis  2.  Thyromegaly  PLAN: 1. Hashimoto thyroiditis -Patient with history of euthyroid Hashimoto's thyroiditis with mildly elevated ATA and TSI antibodies.  We discussed that this can happen in the setting of thyroiditis,  and he does not meet that she absolutely need treatment.  Her TFTs have been normal ever since 2010 but she was started on a thyroid + pituitary thyroid hormone supplement by her chiropractor/functional medicine provider.  We discussed that with normal thyroid tests, such an intervention can be deleterious to her health and I suggested that she came off the medication.  We designed a regimen of tapering down her supplement and I advised her to come for labs approximately 1 month after she stops completely.  She did so 5 days ago and her TFTs were normal. -We did discuss at last visit and again today about the fact that Hashimoto's thyroiditis and autoimmune disease which leads to hypothyroidism over time.  It may progress for many years before actually causing a thyroid hormone deficit, so we need to follow her TFTs and decide about when to start her on thyroid supplementation depending on the results. -At last visit she was describing that she had hot flashes which improved with the thyroid supplementation.  However, these are not worse now that she stopped the thyroid supplement.  She did gain weight and has some insomnia, which she attributes to fluoxetine.  She will discuss with PCP to maybe try another medication. -Due to the still elevated thyroid antibodies, I did recommend selenium 200 mcg daily.  We will recheck her TFTs and antithyroid antibodies at next visit.  2.   Thyromegaly -She also has slight symmetric thyromegaly which I explained it is most likely due to Hashimoto's thyroiditis in the setting of inflammation caused by her antithyroid antibodies. -This is not worrisome and he has an undulating course -She does not have a family history of thyroid cancer or personal history of radiation therapy and also no neck compression symptoms -We will continue to follow her clinically.  - time spent with the patient: 25 minutes, of which >50% was spent in obtaining information about her symptoms, reviewing her previous labs, evaluations, and treatments, counseling her about her conditions (please see the discussed topics above), and developing a plan to further investigate and treat  them; she had a number of questions which I addressed.    Philemon Kingdom, MD PhD [facility]

## 2018-10-30 ENCOUNTER — Other Ambulatory Visit: Payer: Self-pay

## 2018-10-30 ENCOUNTER — Ambulatory Visit
Admission: RE | Admit: 2018-10-30 | Discharge: 2018-10-30 | Disposition: A | Discharge status: Home / Self Care | Payer: 59 | Source: Ambulatory Visit | Attending: Obstetrics and Gynecology | Admitting: Obstetrics and Gynecology

## 2018-10-30 DIAGNOSIS — Z1231 Encounter for screening mammogram for malignant neoplasm of breast: Secondary | ICD-10-CM

## 2018-11-06 ENCOUNTER — Ambulatory Visit (INDEPENDENT_AMBULATORY_CARE_PROVIDER_SITE_OTHER): Payer: 59 | Admitting: Family Medicine

## 2018-11-06 ENCOUNTER — Other Ambulatory Visit: Payer: Self-pay

## 2018-11-06 ENCOUNTER — Encounter: Payer: Self-pay | Admitting: Family Medicine

## 2018-11-06 DIAGNOSIS — F411 Generalized anxiety disorder: Secondary | ICD-10-CM | POA: Diagnosis not present

## 2018-11-06 MED ORDER — SERTRALINE HCL 25 MG PO TABS: 25.0000 mg | ORAL_TABLET | Freq: Every day | ORAL | 2 refills | Status: DC

## 2018-11-06 NOTE — Progress Notes (Signed)
   Subjective:    Patient ID: Tracy Baldwin, female    DOB: 1970/06/24, 48 y.o.   MRN: [id number]  HPI Virtual Visit via Video Note  I connected with the patient on 11/06/18 at 10:00 AM EDT by a video enabled telemedicine application and verified that I am speaking with the correct person using two identifiers.  Location patient: home Location provider:work or home office Persons participating in the virtual visit: patient, provider  I discussed the limitations of evaluation and management by telemedicine and the availability of in person appointments. The patient expressed understanding and agreed to proceed.   HPI: Here to follow up on anxiety. For the past 2 months she has been taking Prozac 10 mg daily, and this has been very helpful with her anxiety during the day. However it seems to have a stimulating effect at night, and she has trouble relaxing and getting to sleep. She has also gained about 7 lbs.   ROS: See pertinent positives and negatives per HPI.  Past Medical History:  Diagnosis Date  . Allergy   . Headache(784.0)   . Heart murmur    as a child/resolved    Past Surgical History:  Procedure Laterality Date  . COLONOSCOPY  06/21/2016   per Dr. Penelope Coop, benign polyp, repeat in 5 yrs     Family History  Problem Relation Age of Onset  . Arthritis Other        fhx  . Coronary artery disease Other        fhx  . Cancer Other        colon, lung, ovarian/fhx  . Diabetes Other        fhx  . Hypertension Other        fhx  . Stroke Other        fhx  . Colon polyps Father   . Breast cancer Neg Hx      Current Outpatient Medications:  .  NON FORMULARY, , Disp: , Rfl:  .  sertraline (ZOLOFT) 25 MG tablet, Take 1 tablet (25 mg total) by mouth daily., Disp: 30 tablet, Rfl: 2  EXAM:  VITALS per patient if applicable:  GENERAL: alert, oriented, appears well and in no acute distress  HEENT: atraumatic, conjunttiva clear, no obvious abnormalities on  inspection of external nose and ears  NECK: normal movements of the head and neck  LUNGS: on inspection no signs of respiratory distress, breathing rate appears normal, no obvious gross SOB, gasping or wheezing  CV: no obvious cyanosis  MS: moves all visible extremities without noticeable abnormality  PSYCH/NEURO: pleasant and cooperative, no obvious depression or anxiety, speech and thought processing grossly intact  ASSESSMENT AND PLAN: Anxiety, now with side effects from Prozac. We will stop this and instead try Zoloft 50 mg daily. Recheck in 3-4 weeks.  Alysia Penna, MD  Discussed the following assessment and plan:  No diagnosis found.     I discussed the assessment and treatment plan with the patient. The patient was provided an opportunity to ask questions and all were answered. The patient agreed with the plan and demonstrated an understanding of the instructions.   The patient was advised to call back or seek an in-person evaluation if the symptoms worsen or if the condition fails to improve as anticipated.     Review of Systems     Objective:   Physical Exam        Assessment & Plan:

## 2018-12-22 IMAGING — MG 2D DIGITAL SCREENING BILATERAL MAMMOGRAM WITH CAD AND ADJUNCT TO
8 of 12 series · 8 of 28 positions shown · non-contrast
Comparison: Previous exam(s).

CLINICAL DATA: Screening.

EXAM:
2D DIGITAL SCREENING BILATERAL MAMMOGRAM WITH CAD AND ADJUNCT TOMO

[L CC]
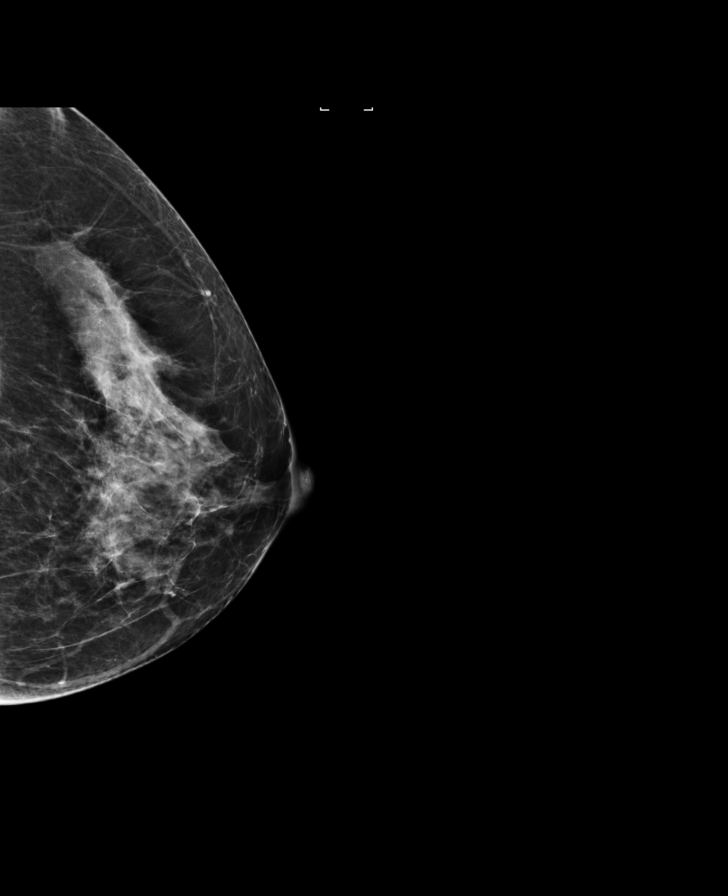

[R CC synth-2D]
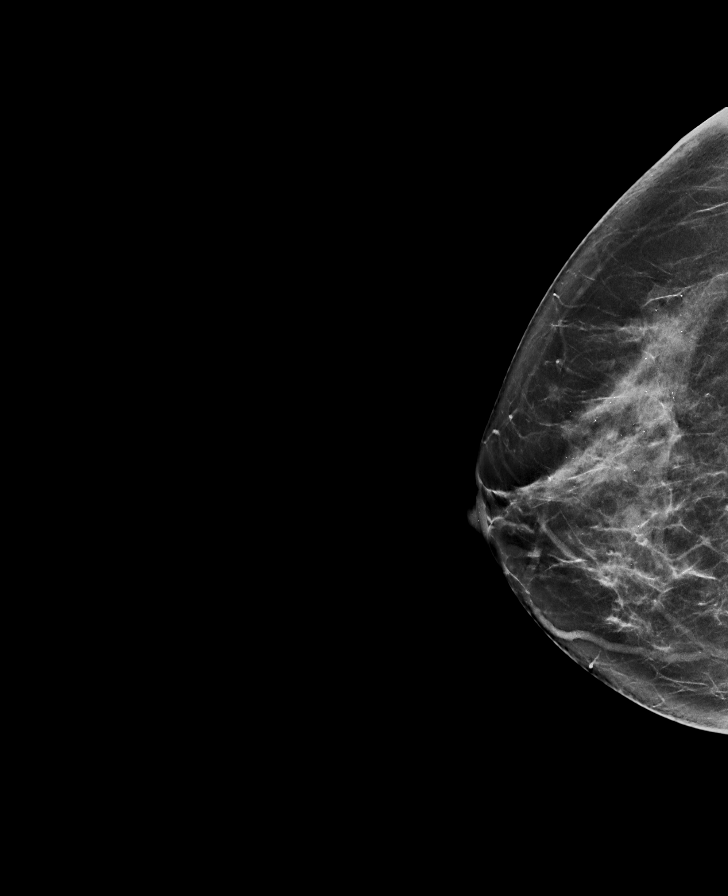

[L CC synth-2D]
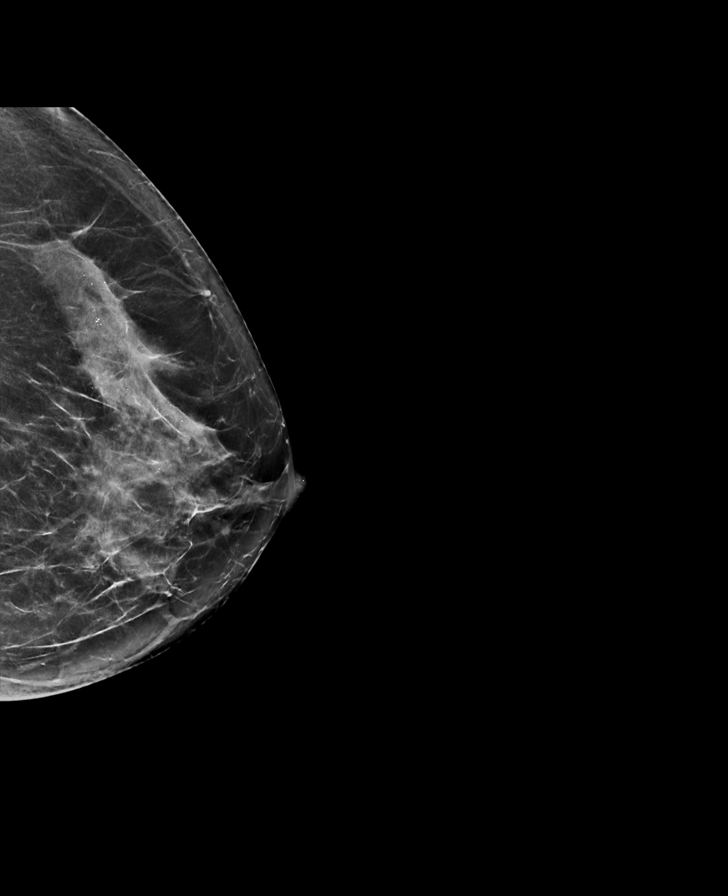

[L MLO]
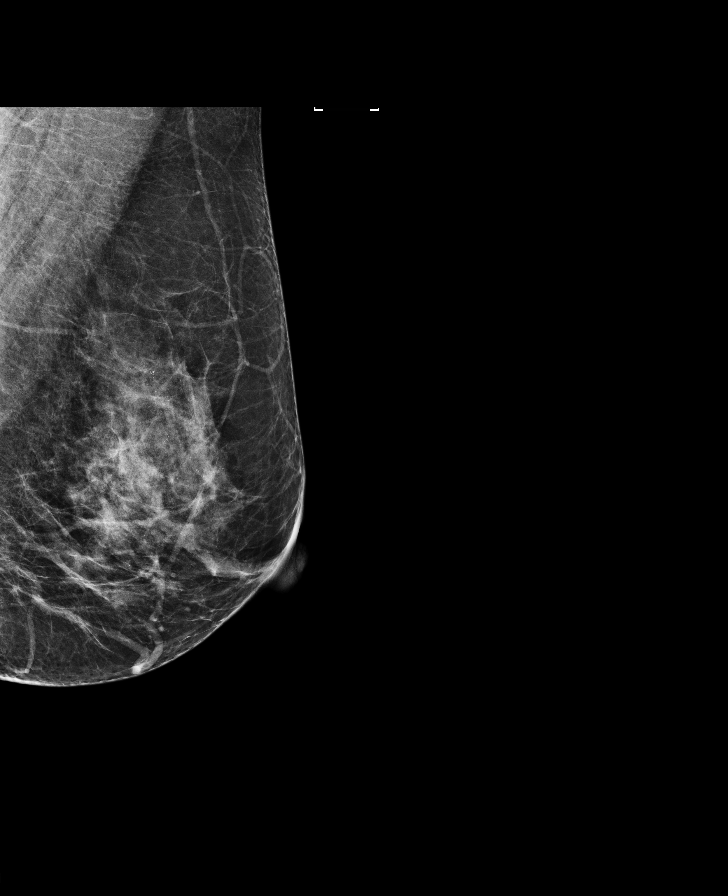

[R MLO synth-2D]
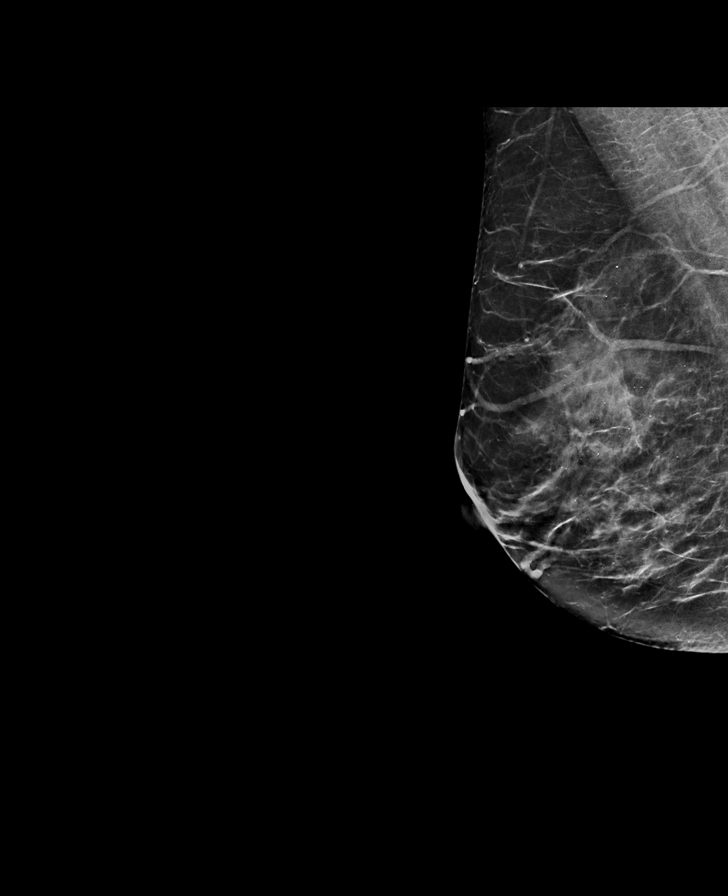

[R CC]
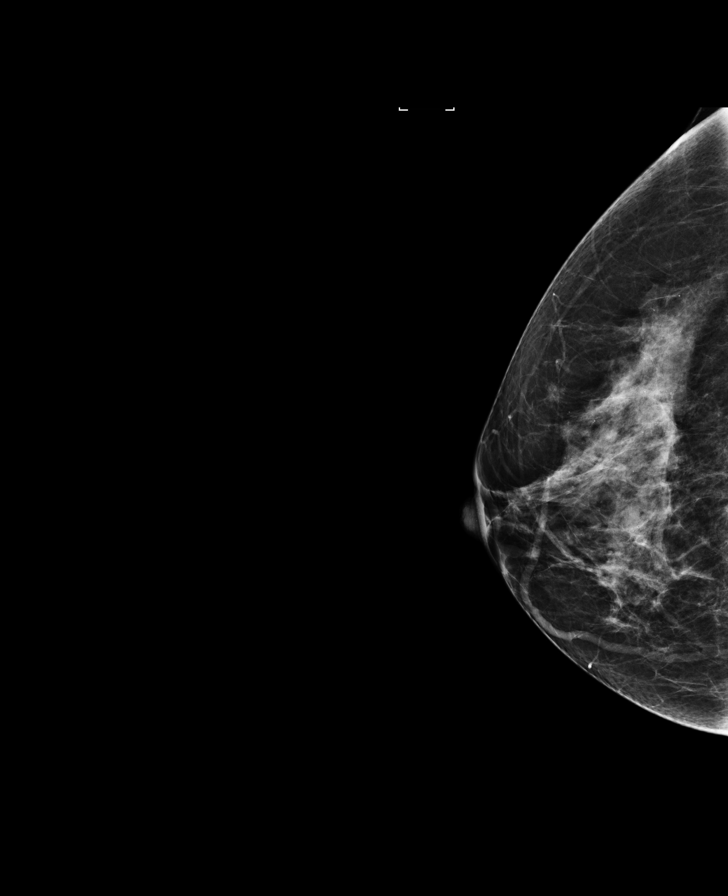

[L MLO synth-2D]
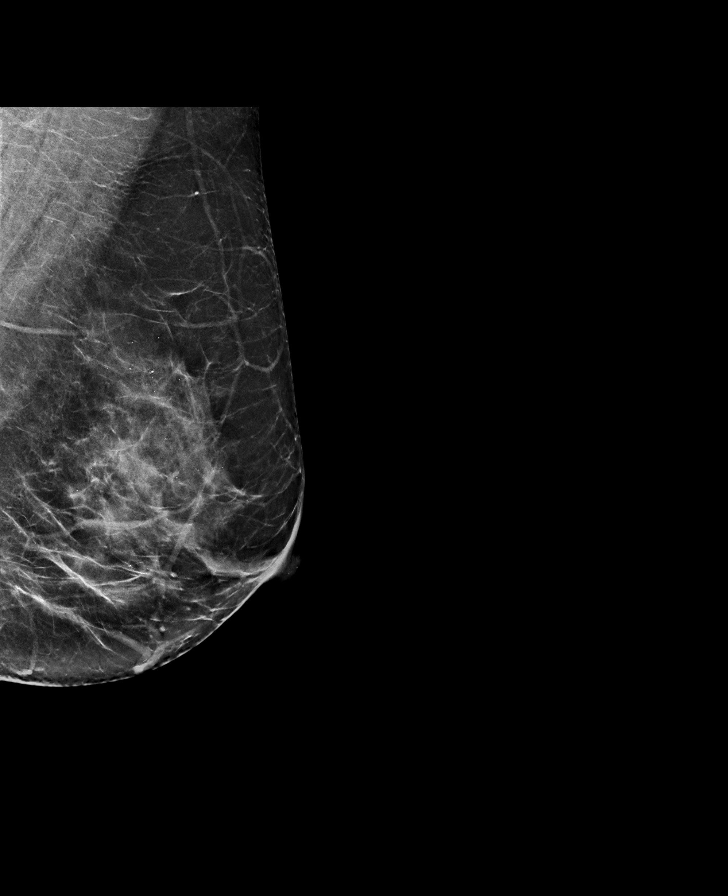

[R MLO]
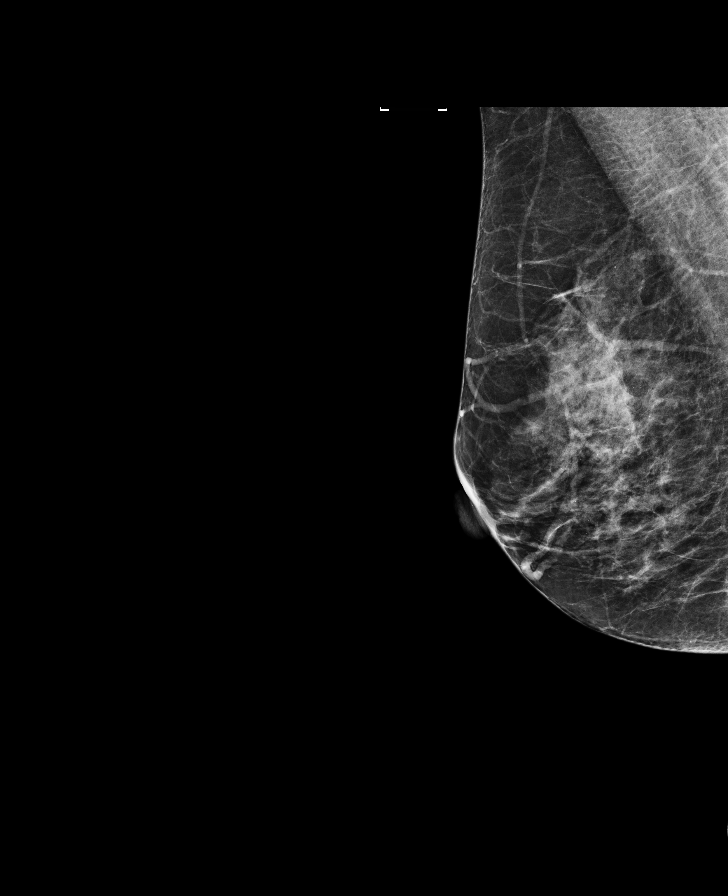

[8 of 28 positions shown; findings below may reference images not displayed]

ACR Breast Density Category c: The breast tissue is heterogeneously
dense, which may obscure small masses.
FINDINGS: There are no findings suspicious for malignancy. Images were
processed with CAD.
IMPRESSION: No mammographic evidence of malignancy. A result letter of this
screening mammogram will be mailed directly to the patient.

RECOMMENDATION:
Screening mammogram in one year. (Code:[id number])

BI-RADS CATEGORY  1: Negative.

## 2018-12-23 ENCOUNTER — Telehealth (INDEPENDENT_AMBULATORY_CARE_PROVIDER_SITE_OTHER): Payer: 59 | Admitting: Family Medicine

## 2018-12-23 ENCOUNTER — Other Ambulatory Visit: Payer: Self-pay

## 2018-12-23 ENCOUNTER — Encounter: Payer: Self-pay | Admitting: Family Medicine

## 2018-12-23 DIAGNOSIS — J019 Acute sinusitis, unspecified: Secondary | ICD-10-CM | POA: Diagnosis not present

## 2018-12-23 MED ORDER — AZITHROMYCIN 250 MG PO TABS: ORAL_TABLET | ORAL | 0 refills | Status: AC

## 2018-12-23 NOTE — Progress Notes (Signed)
Virtual Visit via Video Note  I connected with the patient on 12/23/18 at  8:45 AM EDT by a video enabled telemedicine application and verified that I am speaking with the correct person using two identifiers.  Location patient: home Location provider:work or home office Persons participating in the virtual visit: patient, provider  I discussed the limitations of evaluation and management by telemedicine and the availability of in person appointments. The patient expressed understanding and agreed to proceed.   HPI: Here for 2 weeks of sinus congestion, PND, and blowing yellow mucus from the nose. No fever or cough or SOB. Using Sudafed.    ROS: See pertinent positives and negatives per HPI.  Past Medical History:  Diagnosis Date  . Allergy   . Headache(784.0)   . Heart murmur    as a child/resolved    Past Surgical History:  Procedure Laterality Date  . COLONOSCOPY  06/21/2016   per Dr. Penelope Coop, benign polyp, repeat in 5 yrs     Family History  Problem Relation Age of Onset  . Arthritis Other        fhx  . Coronary artery disease Other        fhx  . Cancer Other        colon, lung, ovarian/fhx  . Diabetes Other        fhx  . Hypertension Other        fhx  . Stroke Other        fhx  . Colon polyps Father   . Breast cancer Neg Hx      Current Outpatient Medications:  .  NON FORMULARY, , Disp: , Rfl:  .  sertraline (ZOLOFT) 25 MG tablet, Take 1 tablet (25 mg total) by mouth daily., Disp: 30 tablet, Rfl: 2  EXAM:  VITALS per patient if applicable:  GENERAL: alert, oriented, appears well and in no acute distress  HEENT: atraumatic, conjunttiva clear, no obvious abnormalities on inspection of external nose and ears  NECK: normal movements of the head and neck  LUNGS: on inspection no signs of respiratory distress, breathing rate appears normal, no obvious gross SOB, gasping or wheezing  CV: no obvious cyanosis  MS: moves all visible extremities without  noticeable abnormality  PSYCH/NEURO: pleasant and cooperative, no obvious depression or anxiety, speech and thought processing grossly intact  ASSESSMENT AND PLAN: Sinusitis, treat with a Zpack.  Alysia Penna, MD  Discussed the following assessment and plan:  No diagnosis found.     I discussed the assessment and treatment plan with the patient. The patient was provided an opportunity to ask questions and all were answered. The patient agreed with the plan and demonstrated an understanding of the instructions.   The patient was advised to call back or seek an in-person evaluation if the symptoms worsen or if the condition fails to improve as anticipated.

## 2019-02-04 ENCOUNTER — Other Ambulatory Visit: Payer: Self-pay | Admitting: Family Medicine

## 2019-05-04 ENCOUNTER — Other Ambulatory Visit: Payer: Self-pay | Admitting: Family Medicine
